# Patient Record
Sex: Male | Born: 1980 | Race: Black or African American | Hispanic: No | Smoking: Never smoker
Health system: Southern US, Community
[De-identification: ages and names within clinical notes are randomized; demographics above are authoritative.]

## PROBLEM LIST (undated history)

## (undated) DIAGNOSIS — C6290 Malignant neoplasm of unspecified testis, unspecified whether descended or undescended: Secondary | ICD-10-CM

## (undated) DIAGNOSIS — C801 Malignant (primary) neoplasm, unspecified: Secondary | ICD-10-CM

## (undated) HISTORY — DX: Malignant neoplasm of unspecified testis, unspecified whether descended or undescended: C62.90

## (undated) HISTORY — PX: SURGERY SCROTAL / TESTICULAR: SUR1316

---

## 2005-12-12 ENCOUNTER — Emergency Department (HOSPITAL_COMMUNITY): Admission: EM | Admit: 2005-12-12 | Discharge: 2005-12-13 | Payer: Self-pay | Admitting: Emergency Medicine

## 2010-05-19 ENCOUNTER — Emergency Department (HOSPITAL_BASED_OUTPATIENT_CLINIC_OR_DEPARTMENT_OTHER)
Admission: EM | Admit: 2010-05-19 | Discharge: 2010-05-19 | Disposition: A | Discharge status: Home / Self Care | Payer: Self-pay | Attending: Emergency Medicine | Admitting: Emergency Medicine

## 2010-05-19 DIAGNOSIS — N509 Disorder of male genital organs, unspecified: Secondary | ICD-10-CM | POA: Insufficient documentation

## 2010-05-19 LAB — URINALYSIS, ROUTINE W REFLEX MICROSCOPIC
Leukocytes, UA: NEGATIVE
Nitrite: NEGATIVE
Specific Gravity, Urine: 1.027 (ref 1.005–1.030)
Urine Glucose, Fasting: NEGATIVE mg/dL
pH: 6 (ref 5.0–8.0)

## 2010-05-19 LAB — URINE MICROSCOPIC-ADD ON

## 2010-05-21 LAB — URINE CULTURE

## 2010-06-18 ENCOUNTER — Emergency Department (HOSPITAL_BASED_OUTPATIENT_CLINIC_OR_DEPARTMENT_OTHER)
Admission: EM | Admit: 2010-06-18 | Discharge: 2010-06-18 | Disposition: A | Discharge status: Home / Self Care | Payer: Self-pay | Attending: Emergency Medicine | Admitting: Emergency Medicine

## 2010-06-18 ENCOUNTER — Emergency Department (INDEPENDENT_AMBULATORY_CARE_PROVIDER_SITE_OTHER): Payer: Self-pay

## 2010-06-18 DIAGNOSIS — N508 Other specified disorders of male genital organs: Secondary | ICD-10-CM | POA: Insufficient documentation

## 2010-06-18 DIAGNOSIS — N433 Hydrocele, unspecified: Secondary | ICD-10-CM | POA: Insufficient documentation

## 2010-06-18 DIAGNOSIS — R9389 Abnormal findings on diagnostic imaging of other specified body structures: Secondary | ICD-10-CM

## 2010-06-18 LAB — URINALYSIS, ROUTINE W REFLEX MICROSCOPIC
Glucose, UA: NEGATIVE mg/dL
Protein, ur: NEGATIVE mg/dL
Specific Gravity, Urine: 1.026 (ref 1.005–1.030)
pH: 5.5 (ref 5.0–8.0)

## 2010-06-23 ENCOUNTER — Other Ambulatory Visit (HOSPITAL_COMMUNITY): Payer: Self-pay | Admitting: Urology

## 2010-06-23 DIAGNOSIS — D4959 Neoplasm of unspecified behavior of other genitourinary organ: Secondary | ICD-10-CM

## 2010-06-26 ENCOUNTER — Other Ambulatory Visit: Payer: Self-pay | Admitting: Urology

## 2010-06-26 ENCOUNTER — Ambulatory Visit (HOSPITAL_COMMUNITY)
Admission: RE | Admit: 2010-06-26 | Discharge: 2010-06-26 | Disposition: A | Discharge status: Home / Self Care | Payer: Self-pay | Source: Ambulatory Visit | Attending: Urology | Admitting: Urology

## 2010-06-26 ENCOUNTER — Encounter (HOSPITAL_COMMUNITY): Payer: Self-pay | Attending: Urology

## 2010-06-26 DIAGNOSIS — N509 Disorder of male genital organs, unspecified: Secondary | ICD-10-CM | POA: Insufficient documentation

## 2010-06-26 DIAGNOSIS — N453 Epididymo-orchitis: Secondary | ICD-10-CM | POA: Insufficient documentation

## 2010-06-26 DIAGNOSIS — D4959 Neoplasm of unspecified behavior of other genitourinary organ: Secondary | ICD-10-CM | POA: Insufficient documentation

## 2010-06-26 DIAGNOSIS — Z01812 Encounter for preprocedural laboratory examination: Secondary | ICD-10-CM | POA: Insufficient documentation

## 2010-06-26 LAB — SURGICAL PCR SCREEN: MRSA, PCR: NEGATIVE

## 2010-06-27 ENCOUNTER — Other Ambulatory Visit (HOSPITAL_COMMUNITY): Payer: Self-pay

## 2010-06-30 ENCOUNTER — Other Ambulatory Visit: Payer: Self-pay | Admitting: Urology

## 2010-06-30 ENCOUNTER — Ambulatory Visit (HOSPITAL_COMMUNITY)
Admission: RE | Admit: 2010-06-30 | Discharge: 2010-06-30 | Disposition: A | Discharge status: Home / Self Care | Payer: Self-pay | Source: Ambulatory Visit | Attending: Urology | Admitting: Urology

## 2010-06-30 DIAGNOSIS — C6290 Malignant neoplasm of unspecified testis, unspecified whether descended or undescended: Secondary | ICD-10-CM | POA: Insufficient documentation

## 2010-07-01 NOTE — Op Note (Signed)
Devin Finley, Devin Finley              ACCOUNT NO.:  [id number]  MEDICAL RECORD NO.:  [id number]           PATIENT TYPE:  O  LOCATION:  DAYL                         FACILITY:  [facility]  PHYSICIAN:  Tillmon Kisling C. Vernie Ammons, M.D.  DATE OF BIRTH:  Aug 17, 1980  DATE OF PROCEDURE:  06/30/2010 DATE OF DISCHARGE:                              OPERATIVE REPORT   PREOPERATIVE DIAGNOSIS:  Left testicular neoplasm.  POSTOPERATIVE DIAGNOSIS:  Left testicular neoplasm.  PROCEDURES: 1. Left radical orchiectomy. 2. Left testicular prosthesis placement.  SURGEON:  Kathrine Rieves C. Vernie Ammons, M.D.  ANESTHESIA:  General.  SPECIMENS:  Left testicle and spermatic cord to pathology.  DRAINS:  None.  TESTICULAR PROSTHESIS:  2.9 x 4.5 cm, 20 cc saline filled  BLOOD LOSS:  Minimal.  COMPLICATIONS:  None.  INDICATIONS:  The patient is a 30 year old male who was having left testicular pain and a scrotal ultrasound was performed.  He was treated with antibiotics and despite this the ultrasound revealed what appeared to be an intratesticular lesion.  Tumor markers were found to be negative (beta HCG and alpha-fetoprotein).  He was maintained on antibiotics and a repeat scrotal ultrasound was performed and the lesion appeared to remain unchanged with possible satellite lesion present within the testicle as well.  We therefore had discussed radical orchiectomy.  He understands the risks, complications and alternatives. He also desires to have a testicular prosthesis inserted at the time his testicle is removed.  DESCRIPTION OF OPERATION:  After informed consent, the patient was brought to the major OR, placed on table, and administered general anesthesia.  His genitalia was sterilely prepped and draped including the inguinal region and scrotum.  An official time-out was then performed.  An inguinal incision was then made on the left-hand side following lines of Langer and carried down through the subcutaneous tissue to  expose the external oblique aponeurosis.  I identified the external inguinal ring and divided the fascia along the fibers and through the center of the external ring and then back towards the internal ring.  The ilioinguinal nerve was identified and spared.  The cord was identified and dissected from the floor of the inguinal canal.  A 0.25-inch Penrose drain was then passed around this and I then dissected the cord back to the internal ring.  The cord was then isolated with hemostats into 2 equal portions and clamped.  I then proceeded to deliver the testicle from the left hemiscrotum into the incision and divided the gubernaculum.  I then divided the cord distal to the clamps and the specimen was passed off.  I then doubly ligated both of the cord sections by first placing a 2-0 Vicryl as a suture ligature and then a 0 silk tie was applied.  The clamps were removed, no bleeding was noted and the silks were left long and this was tucked back into the internal ring.  The wound was then irrigated with saline.  I then determined the dependent portion of the scrotum and everted this into the inguinal incision.  A 3-0 silk suture was then placed through the tissue over my index finger.  I measured his  right testicle and it appeared to measure 4.5 x 2.5 cm.  I therefore chose the above-noted prosthesis and filled it with saline and adjusted it so that its consistency was similar to the right testicle.  I then placed the silk through the eye in the dependent portion of the prosthesis and tied this.  The prosthesis was then placed in the left hemiscrotum and it was noted to lay in good position.  The wound was irrigated once again with saline and I then closed the external oblique fascia with running 3-0 Vicryl suture with care being taken to exclude the ilioinguinal nerve. A 0.5% Marcaine with epinephrine was then used to infiltrate the subcutaneous tissue of the incision and interrupted 0  chromic was used to reapproximate the deep tissue and take tension off the skin and skin incision which was then closed with running 4-0 Monocryl suture. Sterile gauze was applied and the patient was awakened and taken to recovery room in stable and satisfactory condition.  He tolerated the procedure well and there were no intraoperative complications.  He was given a prescription upon discharge for Vicodin ES #36 and doxycycline 100 mg q.12 h #10 with followup in my office in 1 week and written discharge instructions.     Neelam Tiggs C. Vernie Ammons, M.D.     MCO/MEDQ  D:  06/30/2010  T:  06/30/2010  Job:  [id number]  Electronically Signed by Ihor Gully M.D. on 07/01/2010 06:50:58 AM

## 2010-07-10 ENCOUNTER — Other Ambulatory Visit (HOSPITAL_COMMUNITY): Payer: Self-pay | Admitting: Urology

## 2010-07-10 DIAGNOSIS — C6290 Malignant neoplasm of unspecified testis, unspecified whether descended or undescended: Secondary | ICD-10-CM

## 2010-07-16 ENCOUNTER — Encounter (HOSPITAL_COMMUNITY): Payer: Self-pay

## 2010-07-16 ENCOUNTER — Ambulatory Visit (HOSPITAL_COMMUNITY)
Admission: RE | Admit: 2010-07-16 | Discharge: 2010-07-16 | Disposition: A | Discharge status: Home / Self Care | Payer: Self-pay | Source: Ambulatory Visit | Attending: Urology | Admitting: Urology

## 2010-07-16 DIAGNOSIS — J984 Other disorders of lung: Secondary | ICD-10-CM | POA: Insufficient documentation

## 2010-07-16 DIAGNOSIS — M899 Disorder of bone, unspecified: Secondary | ICD-10-CM | POA: Insufficient documentation

## 2010-07-16 DIAGNOSIS — C6290 Malignant neoplasm of unspecified testis, unspecified whether descended or undescended: Secondary | ICD-10-CM | POA: Insufficient documentation

## 2010-07-16 DIAGNOSIS — Z9079 Acquired absence of other genital organ(s): Secondary | ICD-10-CM | POA: Insufficient documentation

## 2010-07-16 HISTORY — DX: Malignant (primary) neoplasm, unspecified: C80.1

## 2010-07-16 MED ORDER — IOHEXOL 300 MG/ML  SOLN
125.0000 mL | Freq: Once | INTRAMUSCULAR | Status: AC | PRN
  Administered 2010-07-16: 125 mL via INTRAVENOUS

## 2010-07-22 ENCOUNTER — Ambulatory Visit: Payer: Self-pay | Admitting: Hematology & Oncology

## 2010-07-29 ENCOUNTER — Ambulatory Visit (HOSPITAL_BASED_OUTPATIENT_CLINIC_OR_DEPARTMENT_OTHER): Payer: Self-pay | Admitting: Hematology & Oncology

## 2010-07-29 ENCOUNTER — Other Ambulatory Visit: Payer: Self-pay | Admitting: Hematology & Oncology

## 2010-07-29 DIAGNOSIS — C6290 Malignant neoplasm of unspecified testis, unspecified whether descended or undescended: Secondary | ICD-10-CM

## 2010-07-29 LAB — CBC WITH DIFFERENTIAL (CANCER CENTER ONLY)
EOS%: 2.2 % (ref 0.0–7.0)
Eosinophils Absolute: 0.1 10*3/uL (ref 0.0–0.5)
LYMPH#: 1.9 10*3/uL (ref 0.9–3.3)
MCH: 30.5 pg (ref 28.0–33.4)
MONO%: 8.3 % (ref 0.0–13.0)
NEUT#: 2.2 10*3/uL (ref 1.5–6.5)
Platelets: 166 10*3/uL (ref 145–400)
RBC: 5.25 10*6/uL (ref 4.20–5.70)
WBC: 4.6 10*3/uL (ref 4.0–10.0)

## 2010-07-31 LAB — COMPREHENSIVE METABOLIC PANEL
ALT: 50 U/L (ref 0–53)
Alkaline Phosphatase: 95 U/L (ref 39–117)
CO2: 21 mEq/L (ref 19–32)
Sodium: 138 mEq/L (ref 135–145)
Total Bilirubin: 0.4 mg/dL (ref 0.3–1.2)
Total Protein: 7.5 g/dL (ref 6.0–8.3)

## 2010-07-31 LAB — LACTATE DEHYDROGENASE: LDH: 204 U/L (ref 94–250)

## 2010-08-04 ENCOUNTER — Encounter (HOSPITAL_BASED_OUTPATIENT_CLINIC_OR_DEPARTMENT_OTHER): Payer: Self-pay | Admitting: Hematology & Oncology

## 2010-08-04 DIAGNOSIS — C6290 Malignant neoplasm of unspecified testis, unspecified whether descended or undescended: Secondary | ICD-10-CM

## 2010-08-04 DIAGNOSIS — Z5111 Encounter for antineoplastic chemotherapy: Secondary | ICD-10-CM

## 2010-08-26 ENCOUNTER — Other Ambulatory Visit: Payer: Self-pay | Admitting: Hematology & Oncology

## 2010-08-26 ENCOUNTER — Encounter (HOSPITAL_BASED_OUTPATIENT_CLINIC_OR_DEPARTMENT_OTHER): Payer: Self-pay | Admitting: Hematology & Oncology

## 2010-08-26 DIAGNOSIS — R110 Nausea: Secondary | ICD-10-CM

## 2010-08-26 DIAGNOSIS — C6290 Malignant neoplasm of unspecified testis, unspecified whether descended or undescended: Secondary | ICD-10-CM

## 2010-08-26 LAB — CBC WITH DIFFERENTIAL (CANCER CENTER ONLY)
BASO#: 0 10*3/uL (ref 0.0–0.2)
EOS%: 1.1 % (ref 0.0–7.0)
HCT: 40.2 % (ref 38.7–49.9)
HGB: 14.1 g/dL (ref 13.0–17.1)
MCH: 30.6 pg (ref 28.0–33.4)
MCHC: 35.1 g/dL (ref 32.0–35.9)
MONO%: 8.5 % (ref 0.0–13.0)
NEUT%: 53.9 % (ref 40.0–80.0)

## 2010-08-28 LAB — COMPREHENSIVE METABOLIC PANEL
ALT: 666 U/L — ABNORMAL HIGH (ref 0–53)
AST: 274 U/L — ABNORMAL HIGH (ref 0–37)
Albumin: 4.5 g/dL (ref 3.5–5.2)
BUN: 18 mg/dL (ref 6–23)
CO2: 26 mEq/L (ref 19–32)
Calcium: 9.6 mg/dL (ref 8.4–10.5)
Chloride: 101 mEq/L (ref 96–112)
Creatinine, Ser: 1.12 mg/dL (ref 0.50–1.35)
Potassium: 3.9 mEq/L (ref 3.5–5.3)

## 2010-08-28 LAB — BETA HCG QUANT (REF LAB): Beta hCG, Tumor Marker: <0.5 m[IU]/mL (ref <5.0)

## 2010-08-28 LAB — LACTATE DEHYDROGENASE: LDH: 380 U/L — ABNORMAL HIGH (ref 94–250)

## 2010-08-29 ENCOUNTER — Other Ambulatory Visit: Payer: Self-pay | Admitting: Hematology & Oncology

## 2010-08-29 ENCOUNTER — Encounter (HOSPITAL_BASED_OUTPATIENT_CLINIC_OR_DEPARTMENT_OTHER): Payer: Self-pay | Admitting: Hematology & Oncology

## 2010-08-29 DIAGNOSIS — Z5111 Encounter for antineoplastic chemotherapy: Secondary | ICD-10-CM

## 2010-08-29 DIAGNOSIS — C6290 Malignant neoplasm of unspecified testis, unspecified whether descended or undescended: Secondary | ICD-10-CM

## 2010-08-29 LAB — CMP (CANCER CENTER ONLY)
ALT(SGPT): 403 U/L — ABNORMAL HIGH (ref 10–47)
AST: 126 U/L — ABNORMAL HIGH (ref 11–38)
Alkaline Phosphatase: 73 U/L (ref 26–84)
CO2: 29 mEq/L (ref 18–33)
Creat: 1 mg/dl (ref 0.6–1.2)
Sodium: 138 mEq/L (ref 128–145)
Total Bilirubin: 0.6 mg/dl (ref 0.20–1.60)
Total Protein: 7.9 g/dL (ref 6.4–8.1)

## 2010-08-29 LAB — CBC WITH DIFFERENTIAL (CANCER CENTER ONLY)
BASO%: 0.3 % (ref 0.0–2.0)
EOS%: 1.4 % (ref 0.0–7.0)
HCT: 40.5 % (ref 38.7–49.9)
LYMPH%: 36.2 % (ref 14.0–48.0)
MCHC: 34.8 g/dL (ref 32.0–35.9)
MCV: 89 fL (ref 82–98)
MONO#: 0.3 10*3/uL (ref 0.1–0.9)
NEUT%: 53.8 % (ref 40.0–80.0)
Platelets: 173 10*3/uL (ref 145–400)
RDW: 13.8 % (ref 11.1–15.7)
WBC: 3.5 10*3/uL — ABNORMAL LOW (ref 4.0–10.0)

## 2010-10-15 ENCOUNTER — Other Ambulatory Visit: Payer: Self-pay | Admitting: Hematology & Oncology

## 2010-10-15 ENCOUNTER — Encounter (HOSPITAL_BASED_OUTPATIENT_CLINIC_OR_DEPARTMENT_OTHER): Payer: Self-pay | Admitting: Hematology & Oncology

## 2010-10-15 DIAGNOSIS — C6290 Malignant neoplasm of unspecified testis, unspecified whether descended or undescended: Secondary | ICD-10-CM

## 2010-10-15 DIAGNOSIS — Z5111 Encounter for antineoplastic chemotherapy: Secondary | ICD-10-CM

## 2010-10-15 DIAGNOSIS — R52 Pain, unspecified: Secondary | ICD-10-CM

## 2010-10-15 DIAGNOSIS — R110 Nausea: Secondary | ICD-10-CM

## 2010-10-15 LAB — CBC WITH DIFFERENTIAL (CANCER CENTER ONLY)
BASO#: 0 10*3/uL (ref 0.0–0.2)
EOS%: 2.8 % (ref 0.0–7.0)
Eosinophils Absolute: 0.1 10*3/uL (ref 0.0–0.5)
HCT: 40.3 % (ref 38.7–49.9)
HGB: 14.5 g/dL (ref 13.0–17.1)
MCH: 31.7 pg (ref 28.0–33.4)
MCHC: 36 g/dL — ABNORMAL HIGH (ref 32.0–35.9)
MCV: 88 fL (ref 82–98)
MONO%: 10.3 % (ref 0.0–13.0)
NEUT#: 1.8 10*3/uL (ref 1.5–6.5)
NEUT%: 35.1 % — ABNORMAL LOW (ref 40.0–80.0)
RBC: 4.57 10*6/uL (ref 4.20–5.70)

## 2010-10-18 LAB — COMPREHENSIVE METABOLIC PANEL
Alkaline Phosphatase: 121 U/L — ABNORMAL HIGH (ref 39–117)
Creatinine, Ser: 1.17 mg/dL (ref 0.50–1.35)
Glucose, Bld: 98 mg/dL (ref 70–99)
Sodium: 138 mEq/L (ref 135–145)
Total Bilirubin: 0.3 mg/dL (ref 0.3–1.2)
Total Protein: 7 g/dL (ref 6.0–8.3)

## 2010-10-18 LAB — LACTATE DEHYDROGENASE: LDH: 195 U/L (ref 94–250)

## 2010-10-18 LAB — AFP TUMOR MARKER: AFP-Tumor Marker: 3.1 ng/mL (ref 0.0–8.0)

## 2010-11-12 ENCOUNTER — Ambulatory Visit (HOSPITAL_BASED_OUTPATIENT_CLINIC_OR_DEPARTMENT_OTHER)
Admission: RE | Admit: 2010-11-12 | Discharge: 2010-11-12 | Disposition: A | Discharge status: Home / Self Care | Payer: Self-pay | Source: Ambulatory Visit | Attending: Hematology & Oncology | Admitting: Hematology & Oncology

## 2010-11-12 DIAGNOSIS — Z8547 Personal history of malignant neoplasm of testis: Secondary | ICD-10-CM

## 2010-11-12 DIAGNOSIS — Z9079 Acquired absence of other genital organ(s): Secondary | ICD-10-CM

## 2010-11-12 DIAGNOSIS — Z0389 Encounter for observation for other suspected diseases and conditions ruled out: Secondary | ICD-10-CM

## 2010-11-12 DIAGNOSIS — C6290 Malignant neoplasm of unspecified testis, unspecified whether descended or undescended: Secondary | ICD-10-CM | POA: Insufficient documentation

## 2010-11-12 MED ORDER — IOHEXOL 300 MG/ML  SOLN: 100.0000 mL | Freq: Once | INTRAMUSCULAR | Status: DC | PRN

## 2010-11-20 ENCOUNTER — Encounter (HOSPITAL_BASED_OUTPATIENT_CLINIC_OR_DEPARTMENT_OTHER): Payer: Self-pay | Admitting: Hematology & Oncology

## 2010-11-20 ENCOUNTER — Other Ambulatory Visit: Payer: Self-pay | Admitting: Hematology & Oncology

## 2010-11-20 DIAGNOSIS — Z5111 Encounter for antineoplastic chemotherapy: Secondary | ICD-10-CM

## 2010-11-20 DIAGNOSIS — R110 Nausea: Secondary | ICD-10-CM

## 2010-11-20 DIAGNOSIS — C6290 Malignant neoplasm of unspecified testis, unspecified whether descended or undescended: Secondary | ICD-10-CM

## 2010-11-20 LAB — CBC WITH DIFFERENTIAL (CANCER CENTER ONLY)
BASO%: 0.4 % (ref 0.0–2.0)
EOS%: 1.5 % (ref 0.0–7.0)
HCT: 42.5 % (ref 38.7–49.9)
LYMPH%: 47.9 % (ref 14.0–48.0)
MCHC: 36 g/dL — ABNORMAL HIGH (ref 32.0–35.9)
MCV: 89 fL (ref 82–98)
MONO#: 0.3 10*3/uL (ref 0.1–0.9)
MONO%: 7.1 % (ref 0.0–13.0)
NEUT%: 43.1 % (ref 40.0–80.0)
Platelets: 170 10*3/uL (ref 145–400)
RDW: 12.6 % (ref 11.1–15.7)
WBC: 4.5 10*3/uL (ref 4.0–10.0)

## 2010-11-23 LAB — COMPREHENSIVE METABOLIC PANEL WITH GFR
ALT: 54 U/L — ABNORMAL HIGH (ref 0–53)
AST: 48 U/L — ABNORMAL HIGH (ref 0–37)
Albumin: 4.2 g/dL (ref 3.5–5.2)
Alkaline Phosphatase: 104 U/L (ref 39–117)
BUN: 20 mg/dL (ref 6–23)
CO2: 22 meq/L (ref 19–32)
Calcium: 9.3 mg/dL (ref 8.4–10.5)
Chloride: 105 meq/L (ref 96–112)
Creatinine, Ser: 1.08 mg/dL (ref 0.50–1.35)
Glucose, Bld: 125 mg/dL — ABNORMAL HIGH (ref 70–99)
Potassium: 4 meq/L (ref 3.5–5.3)
Sodium: 138 meq/L (ref 135–145)
Total Bilirubin: 0.4 mg/dL (ref 0.3–1.2)
Total Protein: 7 g/dL (ref 6.0–8.3)

## 2010-11-23 LAB — AFP TUMOR MARKER: AFP-Tumor Marker: 3.2 ng/mL (ref 0.0–8.0)

## 2010-11-23 LAB — LACTATE DEHYDROGENASE: LDH: 219 U/L (ref 94–250)

## 2010-11-28 ENCOUNTER — Other Ambulatory Visit: Payer: Self-pay | Admitting: Hematology & Oncology

## 2010-11-28 DIAGNOSIS — C6290 Malignant neoplasm of unspecified testis, unspecified whether descended or undescended: Secondary | ICD-10-CM

## 2011-02-19 ENCOUNTER — Other Ambulatory Visit (HOSPITAL_BASED_OUTPATIENT_CLINIC_OR_DEPARTMENT_OTHER): Payer: Self-pay | Admitting: Lab

## 2011-02-19 ENCOUNTER — Other Ambulatory Visit: Payer: Self-pay | Admitting: Hematology & Oncology

## 2011-02-19 ENCOUNTER — Ambulatory Visit (HOSPITAL_BASED_OUTPATIENT_CLINIC_OR_DEPARTMENT_OTHER)
Admission: RE | Admit: 2011-02-19 | Discharge: 2011-02-19 | Disposition: A | Discharge status: Home / Self Care | Payer: Self-pay | Source: Ambulatory Visit | Attending: Hematology & Oncology | Admitting: Hematology & Oncology

## 2011-02-19 ENCOUNTER — Ambulatory Visit (INDEPENDENT_AMBULATORY_CARE_PROVIDER_SITE_OTHER)
Admission: RE | Admit: 2011-02-19 | Discharge: 2011-02-19 | Disposition: A | Discharge status: Home / Self Care | Payer: Self-pay | Source: Ambulatory Visit | Attending: Hematology & Oncology | Admitting: Hematology & Oncology

## 2011-02-19 DIAGNOSIS — R52 Pain, unspecified: Secondary | ICD-10-CM

## 2011-02-19 DIAGNOSIS — Z09 Encounter for follow-up examination after completed treatment for conditions other than malignant neoplasm: Secondary | ICD-10-CM | POA: Insufficient documentation

## 2011-02-19 DIAGNOSIS — N62 Hypertrophy of breast: Secondary | ICD-10-CM

## 2011-02-19 DIAGNOSIS — C6290 Malignant neoplasm of unspecified testis, unspecified whether descended or undescended: Secondary | ICD-10-CM

## 2011-02-19 DIAGNOSIS — R110 Nausea: Secondary | ICD-10-CM

## 2011-02-19 DIAGNOSIS — I880 Nonspecific mesenteric lymphadenitis: Secondary | ICD-10-CM

## 2011-02-19 DIAGNOSIS — Z5111 Encounter for antineoplastic chemotherapy: Secondary | ICD-10-CM

## 2011-02-19 LAB — CMP (CANCER CENTER ONLY)
ALT(SGPT): 90 U/L — ABNORMAL HIGH (ref 10–47)
AST: 90 U/L — ABNORMAL HIGH (ref 11–38)
BUN, Bld: 27 mg/dL — ABNORMAL HIGH (ref 7–22)
Creat: 1 mg/dl (ref 0.6–1.2)
Total Bilirubin: 0.4 mg/dl (ref 0.20–1.60)

## 2011-02-19 LAB — CBC WITH DIFFERENTIAL (CANCER CENTER ONLY)
BASO%: 0.3 % (ref 0.0–2.0)
LYMPH%: 38 % (ref 14.0–48.0)
MCV: 90 fL (ref 82–98)
MONO#: 0.6 10*3/uL (ref 0.1–0.9)
MONO%: 10.4 % (ref 0.0–13.0)
Platelets: 176 10*3/uL (ref 145–400)
RDW: 13.3 % (ref 11.1–15.7)
WBC: 5.9 10*3/uL (ref 4.0–10.0)

## 2011-02-19 MED ORDER — IOHEXOL 300 MG/ML  SOLN
100.0000 mL | Freq: Once | INTRAMUSCULAR | Status: AC | PRN
  Administered 2011-02-19: 100 mL via INTRAVENOUS

## 2011-02-23 LAB — AFP TUMOR MARKER: AFP-Tumor Marker: 2.4 ng/mL (ref 0.0–8.0)

## 2011-02-23 LAB — BETA HCG QUANT (REF LAB): Beta hCG, Tumor Marker: <0.5 m[IU]/mL (ref <5.0)

## 2011-02-26 ENCOUNTER — Encounter: Payer: Self-pay | Admitting: Hematology & Oncology

## 2011-02-26 ENCOUNTER — Ambulatory Visit (HOSPITAL_BASED_OUTPATIENT_CLINIC_OR_DEPARTMENT_OTHER): Payer: Self-pay | Admitting: Hematology & Oncology

## 2011-02-26 DIAGNOSIS — C6290 Malignant neoplasm of unspecified testis, unspecified whether descended or undescended: Secondary | ICD-10-CM

## 2011-02-26 DIAGNOSIS — K752 Nonspecific reactive hepatitis: Secondary | ICD-10-CM

## 2011-02-26 HISTORY — DX: Malignant neoplasm of unspecified testis, unspecified whether descended or undescended: C62.90

## 2011-02-26 NOTE — Progress Notes (Signed)
CC:   Mark C. Vernie Ammons, M.D.  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  CURRENT THERAPY:  Observation.  INTERIM HISTORY:  Mr. Mousseau comes in for followup.  He is doing well. Unfortunately he is now separated from his wife.  This is unfortunate. I feel bad for him.  He is working without any problems.  He is enjoying this.  He had a good Thanksgiving.  He is working out quit a bit.  He is taking protein supplements to maintain his muscle mass.  When we last saw him in August, his tumor markers were normal.  Alpha- fetoprotein was 3.2.  His beta hCG was less than 0.5.  LDH was 219.  He has had elevated LFTs on occasion.  I am not sure as to what the cause of this has been.  He did have recent scans done.  He had a CT of the chest, abdomen, and pelvis back on 11/29.  The CT scan showed no evidence of thoracic disease.  He did have left periaortic lymph nodes which are less than 1 cm.  It is unchanged.  There is a mesenteric lymph node measuring 1.4 cm, again which is unchanged.  There is an additional mesenteric lymph node which appeared to be minimally larger measuring 1.2 cm.  His liver looks okay with no focal hepatic or gallbladder lesions.  He has had no problems going to the bathroom.  He has had no abdominal pain.  He has had no cough.  There is no dyspnea or tachypnea.  He has no dysphasia or odynophagia.  PHYSICAL EXAM:  General:  This is a well-developed well-nourished African American gentleman who is quite muscular.  Vital signs: Temperature of 97.4, pulse 70, respiratory 18, blood pressure 115/74, weight 224.  Head and neck:  Shows a normocephalic, atraumatic skull. There are no ocular or oral lesions.  No palpable cervical or supraclavicular lymph nodes.  Lungs:  Clear bilaterally.  Cardiac: Regular rhythm with normal S1, S2.  No murmurs, rubs or bruits. Abdomen:  Soft with good bowel sounds.  There are no palpable abdominal mass.  There is no  palpable hepatosplenomegaly.  He has well-healed left inguinal orchiectomy scar.  Axillary exam shows no bilateral axillary adenopathy.  Extremities:  Shows no clubbing, cyanosis or edema.  Skin: No rashes, ecchymoses or petechiae.  Neurological:  No focal neurological deficits.  LABORATORY STUDIES:  White cell count of 5.9, hemoglobin 16.1, hematocrit 47.8,  platelet count 176.  His electrolytes all appear unremarkable.  His SGPT is 90, SGOT is 90.  Alkaline phosphatase is 121. LDH is 269.  Beta hCG is less than 0.5.  Alpha-fetoprotein is 2.4.  IMPRESSION AND PLAN:  Mr. Hinchman is a real nice 30 year old African American gentleman with a stage I seminoma of the left testicle.  He had this resected.  We gave him 1 cycle of adjuvant chemotherapy with high- dose carboplatin.  This was back in May.  I still do not see any evidence of recurrent disease.  I think these lymph nodes are more reactionary than malignant.  As far as his LFTs are concerned, I told Mr. Gilkerson to stop taking any supplements for his working out.  It is possible that he may be taking some that could be causing some liver inflammation.  I do want to repeat his liver function tests in 1 month.  He does not need to have another CT scan until late February or early March.  We will get these all set up.  I will plan to see him back myself after his scans are done in March.    ______________________________ Josph Macho, M.D. PRE/MEDQ  D:  02/26/2011  T:  02/26/2011  Job:  [id number]

## 2011-02-26 NOTE — Progress Notes (Signed)
This office note has been dictated.

## 2011-02-27 ENCOUNTER — Telehealth: Payer: Self-pay | Admitting: *Deleted

## 2011-02-27 NOTE — Telephone Encounter (Signed)
Mailed Pt's Jan, Feb, March schedules to Pt with instruction sheet for CT

## 2011-03-30 ENCOUNTER — Other Ambulatory Visit (HOSPITAL_BASED_OUTPATIENT_CLINIC_OR_DEPARTMENT_OTHER): Payer: Self-pay

## 2011-03-30 ENCOUNTER — Other Ambulatory Visit: Payer: Self-pay | Admitting: Lab

## 2011-03-31 ENCOUNTER — Telehealth: Payer: Self-pay | Admitting: Hematology & Oncology

## 2011-03-31 NOTE — Telephone Encounter (Signed)
Pt called and resch 03/30/11 missed apt to 04/01/11

## 2011-04-01 ENCOUNTER — Other Ambulatory Visit (HOSPITAL_BASED_OUTPATIENT_CLINIC_OR_DEPARTMENT_OTHER): Payer: Self-pay | Admitting: Lab

## 2011-04-01 DIAGNOSIS — C6290 Malignant neoplasm of unspecified testis, unspecified whether descended or undescended: Secondary | ICD-10-CM

## 2011-04-01 DIAGNOSIS — K752 Nonspecific reactive hepatitis: Secondary | ICD-10-CM

## 2011-04-01 DIAGNOSIS — K759 Inflammatory liver disease, unspecified: Secondary | ICD-10-CM

## 2011-04-01 LAB — HEPATIC FUNCTION PANEL
ALT: 61 U/L — ABNORMAL HIGH (ref 0–53)
AST: 50 U/L — ABNORMAL HIGH (ref 0–37)
Albumin: 4.5 g/dL (ref 3.5–5.2)
Alkaline Phosphatase: 108 U/L (ref 39–117)
Indirect Bilirubin: 0.3 mg/dL (ref 0.0–0.9)
Total Protein: 7.2 g/dL (ref 6.0–8.3)

## 2011-05-21 ENCOUNTER — Ambulatory Visit (HOSPITAL_BASED_OUTPATIENT_CLINIC_OR_DEPARTMENT_OTHER)
Admission: RE | Admit: 2011-05-21 | Discharge: 2011-05-21 | Disposition: A | Discharge status: Home / Self Care | Payer: Self-pay | Source: Ambulatory Visit | Attending: Hematology & Oncology | Admitting: Hematology & Oncology

## 2011-05-21 ENCOUNTER — Ambulatory Visit (INDEPENDENT_AMBULATORY_CARE_PROVIDER_SITE_OTHER)
Admission: RE | Admit: 2011-05-21 | Discharge: 2011-05-21 | Disposition: A | Discharge status: Home / Self Care | Payer: Self-pay | Source: Ambulatory Visit | Attending: Hematology & Oncology | Admitting: Hematology & Oncology

## 2011-05-21 ENCOUNTER — Other Ambulatory Visit (HOSPITAL_BASED_OUTPATIENT_CLINIC_OR_DEPARTMENT_OTHER): Payer: Self-pay | Admitting: Lab

## 2011-05-21 DIAGNOSIS — C6290 Malignant neoplasm of unspecified testis, unspecified whether descended or undescended: Secondary | ICD-10-CM | POA: Insufficient documentation

## 2011-05-21 DIAGNOSIS — K752 Nonspecific reactive hepatitis: Secondary | ICD-10-CM

## 2011-05-21 DIAGNOSIS — Z9079 Acquired absence of other genital organ(s): Secondary | ICD-10-CM

## 2011-05-21 DIAGNOSIS — R599 Enlarged lymph nodes, unspecified: Secondary | ICD-10-CM | POA: Insufficient documentation

## 2011-05-21 DIAGNOSIS — N62 Hypertrophy of breast: Secondary | ICD-10-CM

## 2011-05-21 LAB — CMP (CANCER CENTER ONLY)
ALT(SGPT): 54 U/L — ABNORMAL HIGH (ref 10–47)
AST: 51 U/L — ABNORMAL HIGH (ref 11–38)
Albumin: 3.7 g/dL (ref 3.3–5.5)
Alkaline Phosphatase: 108 U/L — ABNORMAL HIGH (ref 26–84)
BUN, Bld: 22 mg/dL (ref 7–22)
Potassium: 4.6 mEq/L (ref 3.3–4.7)
Sodium: 139 mEq/L (ref 128–145)
Total Protein: 7.7 g/dL (ref 6.4–8.1)

## 2011-05-21 LAB — CBC WITH DIFFERENTIAL (CANCER CENTER ONLY)
Eosinophils Absolute: 0.1 10*3/uL (ref 0.0–0.5)
LYMPH#: 2.4 10*3/uL (ref 0.9–3.3)
MONO#: 0.8 10*3/uL (ref 0.1–0.9)
NEUT#: 3.4 10*3/uL (ref 1.5–6.5)
Platelets: 161 10*3/uL (ref 145–400)
RBC: 5.28 10*6/uL (ref 4.20–5.70)
WBC: 6.7 10*3/uL (ref 4.0–10.0)

## 2011-05-21 MED ORDER — IOHEXOL 300 MG/ML  SOLN
100.0000 mL | Freq: Once | INTRAMUSCULAR | Status: AC | PRN
  Administered 2011-05-21: 100 mL via INTRAVENOUS

## 2011-05-25 LAB — LACTATE DEHYDROGENASE: LDH: 291 U/L — ABNORMAL HIGH (ref 94–250)

## 2011-05-25 LAB — AFP TUMOR MARKER: AFP-Tumor Marker: 2.2 ng/mL (ref 0.0–8.0)

## 2011-05-25 LAB — BETA HCG QUANT (REF LAB): Beta hCG, Tumor Marker: 1.5 m[IU]/mL (ref <5.0)

## 2011-05-26 ENCOUNTER — Telehealth: Payer: Self-pay | Admitting: Hematology & Oncology

## 2011-05-26 NOTE — Telephone Encounter (Signed)
I left a message on his cell phone about the CT scan results.  I was worried that the cancer had returned and that we are going to need more aggressive chemo for this.  I really feel awful about this!!!  He is a real nice guy.  i feel that we can cure this but it will take more chemo, which I hate!!     I told him to call me anytime.  Cindee Lame

## 2011-05-29 ENCOUNTER — Telehealth: Payer: Self-pay | Admitting: Hematology & Oncology

## 2011-05-29 ENCOUNTER — Ambulatory Visit: Payer: Self-pay | Admitting: Hematology & Oncology

## 2011-05-29 NOTE — Telephone Encounter (Signed)
Pt aware of 3-14 and understands it's very important

## 2011-05-29 NOTE — Telephone Encounter (Signed)
Unable to reach pt tried all numbers left message for him to call.

## 2011-05-29 NOTE — Telephone Encounter (Signed)
Pt called yesterday confirming 3-8 appointment. I left him a message to see if he was coming because he is late.

## 2011-05-29 NOTE — Telephone Encounter (Signed)
Pt moved 3-8 to 3-13 he couldn't get a ride

## 2011-06-03 ENCOUNTER — Ambulatory Visit: Payer: Self-pay | Admitting: Hematology & Oncology

## 2011-06-04 ENCOUNTER — Ambulatory Visit (HOSPITAL_BASED_OUTPATIENT_CLINIC_OR_DEPARTMENT_OTHER): Payer: Self-pay | Admitting: Hematology & Oncology

## 2011-06-04 VITALS — BP 147/81 | HR 76 | Temp 97.1°F | Ht 72.0 in | Wt 231.0 lb

## 2011-06-04 DIAGNOSIS — C6290 Malignant neoplasm of unspecified testis, unspecified whether descended or undescended: Secondary | ICD-10-CM

## 2011-06-04 NOTE — Progress Notes (Signed)
This office note has been dictated.

## 2011-06-04 NOTE — Progress Notes (Signed)
CC:   Mark C. Vernie Ammons, M.D.  DIAGNOSIS:  Recurrent seminoma.  CURRENT THERAPY:  Observation.  INTERIM HISTORY:  Mr. Rembold comes in for his followup.  Unfortunately, it looks like he now has recurrence.  I am actually shocked by this.  I really thought that with his adjuvant chemotherapy, he was cured.  He received his adjuvant carboplatin back in April of 2012.  Routine a CT scan done on 05/07/2011, he does have new abdominal lymphadenopathy.  There is a new left periaortic lymph node measuring 2 x 2.3 cm.  He has a somewhat inferior retroperitoneal lymph node measuring 1.1 x 1 cm.  There is a left a mesenteric lymph node measuring 1.5 cm.  We did do his tumor markers.  This showed an LDH of 291.  His alpha fetoprotein was 2.2.  His beta HCG is 1.5.  Although this is normal, this is up from less than 0.5.  I believe that this is all highly indicative of recurrence.  I do not see that we need to put him through any biopsies.  He feels okay.  He does feel tired.  I told him that it is possible that the fatigue might be related to his recurrence.  He does have some gynecomastia which, again, might be related to his underlying malignancy.  He has had no change in bowel or bladder habits.  He has had no cough. He has had no bony pain.  He has had no leg swelling.  He does have some left scrotal discomfort, which he has had since his surgery.  PHYSICAL EXAM:  General: This is an incredibly muscular, black gentleman in no obvious distress.  Vital signs: 97.1, pulse 76, respiratory 16, blood pressure 147/81, and weight is 231.  Head and neck exam shows a normocephalic, atraumatic skull.  There are no ocular or oral lesions. There are no palpable cervical or supraclavicular lymph nodes.  Lungs are clear bilaterally.  Cardiac examination:  Regular rate and rhythm with a normal S1, S2.  There are no murmurs, rubs, or bruits.  Abdominal exam: Soft with good bowel sounds.  He does have a  well-healed left orchectomy scar.  There is no obvious abdominal mass.  There is no inguinal adenopathy bilaterally.  There is no palpable hepatosplenomegaly.  Back exam:  No tenderness over the spine, ribs, or hips.  Extremities: Shows no clubbing, cyanosis, or edema.  Neurological exam: Shows no focal neurological deficits.  LABORATORY STUDIES:  Show a white cell count 6.7, hemoglobin 616.3, hematocrit 47.2, and platelet count 161.  IMPRESSION:  Mr. Sar is a 31 year old gentleman with recurrent seminoma.  I do believe that we will be able to cure this.  I believe that we can get away with 4 cycles of cisplatin/etoposide.  I want to try to avoid bleomycin, given that he is young and quite athletic.  He will need to have a Port-A-Cath placed.  We will see about getting this set up.  We will go ahead and plan on starting him on April 1st.  I feel that we do have the "luxury" of being able to do his treatment starting after Easter.  I spent a good hour with Mr. Bower.  I just am still shocked that he has recurrence.  We will go ahead and plan to get him back to see [place] the day of his 2nd cycle of chemotherapy, which will be on April 22nd.  I do not think he is going to need Neupogen/Neulasta.  We will see how he does with this 1st cycle of chemo without CSF support.    ______________________________ Josph Macho, M.D. PRE/MEDQ  D:  06/04/2011  T:  06/04/2011  Job:  [id number]

## 2011-06-05 ENCOUNTER — Telehealth: Payer: Self-pay | Admitting: Hematology & Oncology

## 2011-06-05 ENCOUNTER — Encounter: Payer: Self-pay | Admitting: Hematology & Oncology

## 2011-06-05 NOTE — Telephone Encounter (Signed)
Patients phone is always busy unable to reach him thru the other numbers also. Mailed schedule to pt for April may and June appointments with a cover letter to give [place] an alternative number to reach him.

## 2011-06-08 ENCOUNTER — Other Ambulatory Visit: Payer: Self-pay | Admitting: *Deleted

## 2011-06-08 ENCOUNTER — Telehealth: Payer: Self-pay | Admitting: Hematology & Oncology

## 2011-06-08 DIAGNOSIS — C6290 Malignant neoplasm of unspecified testis, unspecified whether descended or undescended: Secondary | ICD-10-CM

## 2011-06-08 NOTE — Telephone Encounter (Signed)
Pt's phone is still busy

## 2011-06-08 NOTE — Telephone Encounter (Signed)
Pt called regarding financial assistance application, medicaid application, and disability application.  I mailed out all three to his home address.  Looked into drug replacement for pt's treatment.  Platinol and Vepesid are not replaceable.

## 2011-06-15 ENCOUNTER — Encounter (HOSPITAL_COMMUNITY): Payer: Self-pay

## 2011-06-15 ENCOUNTER — Other Ambulatory Visit: Payer: Self-pay | Admitting: Physician Assistant

## 2011-06-18 ENCOUNTER — Ambulatory Visit (HOSPITAL_COMMUNITY)
Admission: RE | Admit: 2011-06-18 | Discharge: 2011-06-18 | Disposition: A | Discharge status: Home / Self Care | Payer: Self-pay | Source: Ambulatory Visit | Attending: Hematology & Oncology | Admitting: Hematology & Oncology

## 2011-06-18 ENCOUNTER — Other Ambulatory Visit: Payer: Self-pay | Admitting: Hematology & Oncology

## 2011-06-18 DIAGNOSIS — C6290 Malignant neoplasm of unspecified testis, unspecified whether descended or undescended: Secondary | ICD-10-CM

## 2011-06-18 LAB — CBC
HCT: 49.6 % (ref 39.0–52.0)
MCH: 30.8 pg (ref 26.0–34.0)
MCV: 90.5 fL (ref 78.0–100.0)
Platelets: 164 10*3/uL (ref 150–400)
RBC: 5.48 MIL/uL (ref 4.22–5.81)

## 2011-06-18 MED ORDER — MIDAZOLAM HCL 5 MG/5ML IJ SOLN
INTRAMUSCULAR | Status: AC | PRN
  Administered 2011-06-18: 1 mg via INTRAVENOUS
  Administered 2011-06-18: 1.5 mg via INTRAVENOUS

## 2011-06-18 MED ORDER — FENTANYL CITRATE 0.05 MG/ML IJ SOLN
INTRAMUSCULAR | Status: AC
  Filled 2011-06-18: qty 4

## 2011-06-18 MED ORDER — MIDAZOLAM HCL 2 MG/2ML IJ SOLN
INTRAMUSCULAR | Status: AC
  Filled 2011-06-18: qty 2

## 2011-06-18 MED ORDER — SODIUM CHLORIDE 0.9 % IV SOLN
INTRAVENOUS | Status: DC
  Administered 2011-06-18: 10:00:00 via INTRAVENOUS

## 2011-06-18 MED ORDER — CEFAZOLIN SODIUM 1-5 GM-% IV SOLN: 1.0000 g | Freq: Once | INTRAVENOUS | Status: DC

## 2011-06-18 MED ORDER — CEFAZOLIN SODIUM-DEXTROSE 2-3 GM-% IV SOLR
INTRAVENOUS | Status: AC
  Administered 2011-06-18: 2000 mg
  Filled 2011-06-18: qty 50

## 2011-06-18 MED ORDER — FENTANYL CITRATE 0.05 MG/ML IJ SOLN
INTRAMUSCULAR | Status: AC | PRN
  Administered 2011-06-18: 100 ug via INTRAVENOUS

## 2011-06-18 MED ORDER — FENTANYL CITRATE 0.05 MG/ML IJ SOLN
INTRAMUSCULAR | Status: AC
  Filled 2011-06-18: qty 2

## 2011-06-18 MED ORDER — MIDAZOLAM HCL 2 MG/2ML IJ SOLN
INTRAMUSCULAR | Status: AC
  Filled 2011-06-18: qty 4

## 2011-06-18 MED ORDER — HEPARIN SODIUM (PORCINE) 1000 UNIT/ML IJ SOLN
INTRAMUSCULAR | Status: AC | PRN
  Administered 2011-06-18: 500 [IU]

## 2011-06-18 NOTE — H&P (Signed)
Devin Finley is an 31 y.o. male.   Chief Complaint:" I'm here for a port a cath" HPI: Patient with recurrent testicular seminoma presents today for port a cath placement for chemotherapy.  Past Medical History  Diagnosis Date  . Cancer     testicular seminoma  . Testicular seminoma 02/26/2011    PSH: left orchiectomy No family history on file. Social History:  does not have a smoking history on file. He does not have any smokeless tobacco history on file. His alcohol and drug histories not on file.  Allergies: No Known Allergies  No current outpatient prescriptions on file as of 06/18/2011.   Medications Prior to Admission  Medication Dose Route Frequency Provider Last Rate Last Dose  . 0.9 %  sodium chloride infusion   Intravenous Continuous Simonne Come, MD      . ceFAZolin (ANCEF) 2-3 GM-% IVPB SOLR           . ceFAZolin (ANCEF) IVPB 1 g/50 mL premix  1 g Intravenous Once Simonne Come, MD        Results for orders placed during the hospital encounter of 06/18/11 (from the past 48 hour(s))  APTT     Status: Normal   Collection Time   06/18/11  8:13 AM      Component Value Range Comment   aPTT 30  24 - 37 (seconds)   CBC     Status: Normal   Collection Time   06/18/11  8:13 AM      Component Value Range Comment   WBC 6.5  4.0 - 10.5 (K/uL)    RBC 5.48  4.22 - 5.81 (MIL/uL)    Hemoglobin 16.9  13.0 - 17.0 (g/dL)    HCT 40.9  81.1 - 91.4 (%)    MCV 90.5  78.0 - 100.0 (fL)    MCH 30.8  26.0 - 34.0 (pg)    MCHC 34.1  30.0 - 36.0 (g/dL)    RDW 78.2  95.6 - 21.3 (%)    Platelets 164  150 - 400 (K/uL)   PROTIME-INR     Status: Normal   Collection Time   06/18/11  8:13 AM      Component Value Range Comment   Prothrombin Time 13.2  11.6 - 15.2 (seconds)    INR 0.98  0.00 - 1.49     No results found.  Review of Systems  Constitutional: Negative for fever and chills.  Respiratory: Negative for cough and shortness of breath.   Cardiovascular: Negative for chest pain.    Gastrointestinal: Positive for abdominal pain. Negative for nausea and vomiting.  Genitourinary:       Mild left scrotal pain  Neurological: Negative for headaches.  Endo/Heme/Allergies: Does not bruise/bleed easily.    Blood pressure 132/81, pulse 63, temperature 97.6 F (36.4 C), temperature source Oral, resp. rate 14, height 6' (1.829 m), weight 231 lb (104.781 kg), SpO2 98.00%. Physical Exam  Constitutional: He is oriented to person, place, and time. He appears well-developed and well-nourished.  Cardiovascular: Normal rate and regular rhythm.   Respiratory: Effort normal and breath sounds normal.  GI: Soft. Bowel sounds are normal.  Musculoskeletal: Normal range of motion. He exhibits no edema.  Neurological: He is alert and oriented to person, place, and time.     Assessment/Plan Patient with recurrent testicular seminoma; plan is for port a cath placement for chemotherapy.  Quintara Bost,D KEVIN 06/18/2011, 9:13 AM

## 2011-06-18 NOTE — Discharge Instructions (Signed)
Implanted Port Instructions  An implanted port is a central line that has a round shape and is placed under the skin. It is used for long-term IV (intravenous) access for:   Medicine.   Fluids.   Liquid nutrition, such as TPN (total parenteral nutrition).   Blood samples.  Ports can be placed:   In the chest area just below the collarbone (this is the most common place.)   In the arms.   In the belly (abdomen) area.   In the legs.  PARTS OF THE PORT  A port has 2 main parts:   The reservoir. The reservoir is round, disc-shaped, and will be a small, raised area under your skin.   The reservoir is the part where a needle is inserted (accessed) to either give medicines or to draw blood.   The catheter. The catheter is a long, slender tube that extends from the reservoir. The catheter is placed into a large vein.   Medicine that is inserted into the reservoir goes into the catheter and then into the vein.  INSERTION OF THE PORT   The port is surgically placed in either an operating room or in a procedural area (interventional radiology).   Medicine may be given to help you relax during the procedure.   The skin where the port will be inserted is numbed (local anesthetic).   1 or 2 small cuts (incisions) will be made in the skin to insert the port.   The port can be used after it has been inserted.  INCISION SITE CARE   The incision site may have small adhesive strips on it. This helps keep the incision site closed. Sometimes, no adhesive strips are placed. Instead of adhesive strips, a special kind of surgical glue is used to keep the incision closed.   If adhesive strips were placed on the incision sites, do not take them off. They will fall off on their own.   The incision site may be sore for 1 to 2 days. Pain medicine can help.   Do not get the incision site wet. Bathe or shower as directed by your caregiver.   The incision site should heal in 5 to 7 days. A small scar may form after the  incision has healed.  ACCESSING THE PORT  Special steps must be taken to access the port:   Before the port is accessed, a numbing cream can be placed on the skin. This helps numb the skin over the port site.   A sterile technique is used to access the port.   The port is accessed with a needle. Only "non-coring" port needles should be used to access the port. Once the port is accessed, a blood return should be checked. This helps ensure the port is in the vein and is not clogged (clotted).   If your caregiver believes your port should remain accessed, a clear (transparent) bandage will be placed over the needle site. The bandage and needle will need to be changed every week or as directed by your caregiver.   Keep the bandage covering the needle clean and dry. Do not get it wet. Follow your caregiver's instructions on how to take a shower or bath when the port is accessed.   If your port does not need to stay accessed, no bandage is needed over the port.  FLUSHING THE PORT  Flushing the port keeps it from getting clogged. How often the port is flushed depends on:   If a   constant infusion is running. If a constant infusion is running, the port may not need to be flushed.   If intermittent medicines are given.   If the port is not being used.  For intermittent medicines:   The port will need to be flushed:   After medicines have been given.   After blood has been drawn.   As part of routine maintenance.   A port is normally flushed with:   Normal saline.   Heparin.   Follow your caregiver's advice on how often, how much, and the type of flush to use on your port.  IMPORTANT PORT INFORMATION   Tell your caregiver if you are allergic to heparin.   After your port is placed, you will get a manufacturer's information card. The card has information about your port. Keep this card with you at all times.   There are many types of ports available. Know what kind of port you have.   In case of an  emergency, it may be helpful to wear a medical alert bracelet. This can help alert health care workers that you have a port.   The port can stay in for as long as your caregiver believes it is necessary.   When it is time for the port to come out, surgery will be done to remove it. The surgery will be similar to how the port was put in.   If you are in the hospital or clinic:   Your port will be taken care of and flushed by a nurse.   If you are at home:   A home health care nurse may give medicines and take care of the port.   You or a family member can get special training and directions for giving medicine and taking care of the port at home.  SEEK IMMEDIATE MEDICAL CARE IF:    Your port does not flush or you are unable to get a blood return.   New drainage or pus is coming from the incision.   A bad smell is coming from the incision site.   You develop swelling or increased redness at the incision site.   You develop increased swelling or pain at the port site.   You develop swelling or pain in the surrounding skin near the port.   You have an oral temperature above 102 F (38.9 C), not controlled by medicine.  MAKE SURE YOU:    Understand these instructions.   Will watch your condition.   Will get help right away if you are not doing well or get worse.  Document Released: 03/09/2005 Document Revised: 02/26/2011 Document Reviewed: 05/31/2008  [employer] Patient Information 2012 [employer]          Moderate Sedation, Adult  Moderate sedation is given to help you relax or even sleep through a procedure. You may remain sleepy, be clumsy, or have poor balance for several hours following this procedure. Arrange for a responsible adult, family member, or friend to take you home. A responsible adult should stay with you for at least 24 hours or until the medicines have worn off.   Do not participate in any activities where you could become injured for the next 24 hours, or until you feel normal  again. Do not:   Drive.   Swim.   Ride a bicycle.   Operate heavy machinery.   Cook.   Use power tools.   Climb ladders.   Work at heights.   Do not   make important decisions or sign legal documents until you are improved.   Vomiting may occur if you eat too soon. When you can drink without vomiting, try water, juice, or soup. Try solid foods if you feel little or no nausea.   Only take over-the-counter or prescription medications for pain, discomfort, or fever as directed by your caregiver.If pain medications have been prescribed for you, ask your caregiver how soon it is safe to take them.   Make sure you and your family fully understands everything about the medication given to you. Make sure you understand what side effects may occur.   You should not drink alcohol, take sleeping pills, or medications that cause drowsiness for at least 24 hours.   If you smoke, do not smoke alone.   If you are feeling better, you may resume normal activities 24 hours after receiving sedation.   Keep all appointments as scheduled. Follow all instructions.   Ask questions if you do not understand.  SEEK MEDICAL CARE IF:    Your skin is pale or bluish in color.   You continue to feel sick to your stomach (nauseous) or throw up (vomit).   Your pain is getting worse and not helped by medication.   You have bleeding or swelling.   You are still sleepy or feeling clumsy after 24 hours.  SEEK IMMEDIATE MEDICAL CARE IF:    You develop a rash.   You have difficulty breathing.   You develop any type of allergic problem.   You have a fever.  Document Released: 12/02/2000 Document Revised: 02/26/2011 Document Reviewed: 04/25/2007  [employer] Patient Information 2012 [employer]

## 2011-06-18 NOTE — Procedures (Signed)
Successful placement of right internal approach port-a-cath with tip at superior aspect of the right atrium. The catheter is ready for immediate use. No immediate post procedural complications.

## 2011-06-22 ENCOUNTER — Other Ambulatory Visit: Payer: Self-pay | Admitting: *Deleted

## 2011-06-22 ENCOUNTER — Ambulatory Visit (HOSPITAL_BASED_OUTPATIENT_CLINIC_OR_DEPARTMENT_OTHER): Payer: Self-pay

## 2011-06-22 ENCOUNTER — Telehealth: Payer: Self-pay | Admitting: Hematology & Oncology

## 2011-06-22 VITALS — BP 138/82 | HR 75 | Temp 97.0°F

## 2011-06-22 DIAGNOSIS — Z5111 Encounter for antineoplastic chemotherapy: Secondary | ICD-10-CM

## 2011-06-22 DIAGNOSIS — C6290 Malignant neoplasm of unspecified testis, unspecified whether descended or undescended: Secondary | ICD-10-CM

## 2011-06-22 MED ORDER — SODIUM CHLORIDE 0.9 % IV SOLN
Freq: Once | INTRAVENOUS | Status: AC
  Administered 2011-06-22: 10:00:00 via INTRAVENOUS

## 2011-06-22 MED ORDER — SODIUM CHLORIDE 0.9 % IJ SOLN
10.0000 mL | INTRAMUSCULAR | Status: DC | PRN
  Administered 2011-06-22: 10 mL
  Filled 2011-06-22: qty 10

## 2011-06-22 MED ORDER — LORAZEPAM 1 MG PO TABS: 1.0000 mg | ORAL_TABLET | Freq: Three times a day (TID) | ORAL | Status: AC | PRN

## 2011-06-22 MED ORDER — HEPARIN SOD (PORK) LOCK FLUSH 100 UNIT/ML IV SOLN
500.0000 [IU] | Freq: Once | INTRAVENOUS | Status: AC | PRN
  Administered 2011-06-22: 500 [IU]
  Filled 2011-06-22: qty 5

## 2011-06-22 MED ORDER — DEXAMETHASONE SODIUM PHOSPHATE 4 MG/ML IJ SOLN
20.0000 mg | Freq: Once | INTRAMUSCULAR | Status: AC
  Administered 2011-06-22: 20 mg via INTRAVENOUS

## 2011-06-22 MED ORDER — DEXAMETHASONE 4 MG PO TABS: ORAL_TABLET | ORAL | Status: DC

## 2011-06-22 MED ORDER — POTASSIUM CHLORIDE 2 MEQ/ML IV SOLN
Freq: Once | INTRAVENOUS | Status: AC
  Administered 2011-06-22: 10:00:00 via INTRAVENOUS
  Filled 2011-06-22: qty 10

## 2011-06-22 MED ORDER — SODIUM CHLORIDE 0.9 % IV SOLN
100.0000 mg/m2 | Freq: Once | INTRAVENOUS | Status: AC
  Administered 2011-06-22: 230 mg via INTRAVENOUS
  Filled 2011-06-22: qty 11.5

## 2011-06-22 MED ORDER — ONDANSETRON 16 MG/50ML IVPB (CHCC)
16.0000 mg | Freq: Once | INTRAVENOUS | Status: AC
  Administered 2011-06-22: 16 mg via INTRAVENOUS

## 2011-06-22 MED ORDER — PROMETHAZINE HCL 12.5 MG PO TABS: ORAL_TABLET | ORAL | Status: DC

## 2011-06-22 MED ORDER — SODIUM CHLORIDE 0.9 % IV SOLN
20.0000 mg/m2 | Freq: Once | INTRAVENOUS | Status: AC
  Administered 2011-06-22: 46 mg via INTRAVENOUS
  Filled 2011-06-22: qty 46

## 2011-06-22 NOTE — Patient Instructions (Signed)
[facility] Discharge Instructions for Patients Receiving Chemotherapy  Today you received the following chemotherapy agents Cisplatin, Etoposide  To help prevent nausea and vomiting after your treatment, we encourage you to take your nausea medication:  Phenergan 12.5 mg: 1-2 tabs every 6 hours as needed  Ativan 1mg : apply 1 tab under your tongue every 8 hours as needed for moderate to severe nausea or vomiting, sleep, or anxiety  Decadron 4mg :  Take 2 tabs twice daily starting the day after chemo (Day 6 = 06/27/11) for 3 days. Repeat with each cycle.   Other Medications:  Colace - this is a stool softener. Take 100mg  capsule 2-12 times a day as needed. If you have to take more than 6 capsules of Colace a day call the Cancer Center.  Senna - this is a mild laxative used to treat mild constipation. May take 2 tabs by mouth daily or up to twice a day as needed for mild constipation  Milk of Magnesia - this is a laxative used to treat moderate to severe constipation. May take 2-4 tablespoons every 8 hours as needed. May increase to 8 tablespoons x 1 dose and if no bowel movement call the Cancer Center  Imodium - this is for diarrhea. Take 2 tabs after 1st loose stool and then 1 tab after each loose stool until you go a total of 12 hours without a loose stool. Call Cancer Center if loose stools continue.   EMLA Cream - Apply quarter size application to Port site 1 hour prior to chemotherapy. Do NOT rub in. Cover area with plastic wrap.    If you develop nausea and vomiting that is not controlled by your nausea medication, call the clinic. If it is after clinic hours your family physician or the after hours number for the clinic or go to the Emergency Department.   BELOW ARE SYMPTOMS THAT SHOULD BE REPORTED IMMEDIATELY:  *FEVER GREATER THAN 100.5 F  *CHILLS WITH OR WITHOUT FEVER  NAUSEA AND VOMITING THAT IS NOT CONTROLLED WITH YOUR NAUSEA MEDICATION  *UNUSUAL SHORTNESS OF  BREATH  *UNUSUAL BRUISING OR BLEEDING  TENDERNESS IN MOUTH AND THROAT WITH OR WITHOUT PRESENCE OF ULCERS  *URINARY PROBLEMS  *BOWEL PROBLEMS  UNUSUAL RASH Items with * indicate a potential emergency and should be followed up as soon as possible.  One of the nurses will contact you 24 hours after your treatment. Please let the nurse know about any problems that you may have experienced. Feel free to call the clinic you have any questions or concerns. The clinic phone number is [phone].   I have been informed and understand all the instructions given to me. I know to contact the clinic, my physician, or go to the Emergency Department if any problems should occur. I do not have any questions at this time, but understand that I may call the clinic during office hours   should I have any questions or need assistance in obtaining follow up care.    __________________________________________  _____________  __________ Signature of Patient or Authorized Representative            Date                   Time    __________________________________________ Nurse's Signature

## 2011-06-22 NOTE — Telephone Encounter (Signed)
Spoke to patient today during his infusion appt.  Inquired as to whether he had received the financial, disability, and medicaid applications mailed out to him. Pt confirmed he has received them, but has not yet completed them. Pt stated he "would bring it in tomorrow."

## 2011-06-23 ENCOUNTER — Ambulatory Visit (HOSPITAL_BASED_OUTPATIENT_CLINIC_OR_DEPARTMENT_OTHER): Payer: Self-pay

## 2011-06-23 VITALS — BP 136/83 | HR 84 | Temp 96.7°F

## 2011-06-23 DIAGNOSIS — Z5111 Encounter for antineoplastic chemotherapy: Secondary | ICD-10-CM

## 2011-06-23 DIAGNOSIS — C6290 Malignant neoplasm of unspecified testis, unspecified whether descended or undescended: Secondary | ICD-10-CM

## 2011-06-23 MED ORDER — SODIUM CHLORIDE 0.9 % IV SOLN
20.0000 mg/m2 | Freq: Once | INTRAVENOUS | Status: AC
  Administered 2011-06-23: 46 mg via INTRAVENOUS
  Filled 2011-06-23: qty 46

## 2011-06-23 MED ORDER — SODIUM CHLORIDE 0.9 % IJ SOLN
10.0000 mL | INTRAMUSCULAR | Status: DC | PRN
  Filled 2011-06-23: qty 10

## 2011-06-23 MED ORDER — ALTEPLASE 2 MG IJ SOLR
2.0000 mg | Freq: Once | INTRAMUSCULAR | Status: DC | PRN
  Filled 2011-06-23: qty 2

## 2011-06-23 MED ORDER — POTASSIUM CHLORIDE 2 MEQ/ML IV SOLN
Freq: Once | INTRAVENOUS | Status: AC
  Administered 2011-06-23: 10:00:00 via INTRAVENOUS
  Filled 2011-06-23: qty 10

## 2011-06-23 MED ORDER — SODIUM CHLORIDE 0.9 % IV SOLN
100.0000 mg/m2 | Freq: Once | INTRAVENOUS | Status: AC
  Administered 2011-06-23: 230 mg via INTRAVENOUS
  Filled 2011-06-23: qty 11.5

## 2011-06-23 MED ORDER — ONDANSETRON 16 MG/50ML IVPB (CHCC)
16.0000 mg | Freq: Once | INTRAVENOUS | Status: AC
  Administered 2011-06-23: 16 mg via INTRAVENOUS

## 2011-06-23 MED ORDER — DEXAMETHASONE SODIUM PHOSPHATE 4 MG/ML IJ SOLN
20.0000 mg | Freq: Once | INTRAMUSCULAR | Status: AC
  Administered 2011-06-23: 20 mg via INTRAVENOUS

## 2011-06-23 MED ORDER — SODIUM CHLORIDE 0.9 % IJ SOLN
3.0000 mL | INTRAMUSCULAR | Status: DC | PRN
  Filled 2011-06-23: qty 10

## 2011-06-23 MED ORDER — SODIUM CHLORIDE 0.9 % IV SOLN
Freq: Once | INTRAVENOUS | Status: AC
  Administered 2011-06-23: 09:00:00 via INTRAVENOUS

## 2011-06-23 MED ORDER — HEPARIN SOD (PORK) LOCK FLUSH 100 UNIT/ML IV SOLN
500.0000 [IU] | Freq: Once | INTRAVENOUS | Status: DC | PRN
  Filled 2011-06-23: qty 5

## 2011-06-23 MED ORDER — HEPARIN SOD (PORK) LOCK FLUSH 100 UNIT/ML IV SOLN
250.0000 [IU] | Freq: Once | INTRAVENOUS | Status: DC | PRN
  Filled 2011-06-23: qty 5

## 2011-06-23 NOTE — Progress Notes (Signed)
Pt's urinary output during chemo tx was 4,227ml of urine.

## 2011-06-24 ENCOUNTER — Encounter: Payer: Self-pay | Admitting: Hematology & Oncology

## 2011-06-24 ENCOUNTER — Ambulatory Visit (HOSPITAL_BASED_OUTPATIENT_CLINIC_OR_DEPARTMENT_OTHER): Payer: Self-pay

## 2011-06-24 VITALS — BP 138/88 | HR 77 | Temp 97.2°F

## 2011-06-24 DIAGNOSIS — Z5111 Encounter for antineoplastic chemotherapy: Secondary | ICD-10-CM

## 2011-06-24 DIAGNOSIS — C6290 Malignant neoplasm of unspecified testis, unspecified whether descended or undescended: Secondary | ICD-10-CM

## 2011-06-24 DIAGNOSIS — R112 Nausea with vomiting, unspecified: Secondary | ICD-10-CM

## 2011-06-24 MED ORDER — HEPARIN SOD (PORK) LOCK FLUSH 100 UNIT/ML IV SOLN
500.0000 [IU] | Freq: Once | INTRAVENOUS | Status: AC | PRN
  Administered 2011-06-24: 500 [IU]
  Filled 2011-06-24: qty 5

## 2011-06-24 MED ORDER — SODIUM CHLORIDE 0.9 % IV SOLN
20.0000 mg/m2 | Freq: Once | INTRAVENOUS | Status: AC
  Administered 2011-06-24: 46 mg via INTRAVENOUS
  Filled 2011-06-24: qty 46

## 2011-06-24 MED ORDER — SODIUM CHLORIDE 0.9 % IV SOLN
100.0000 mg/m2 | Freq: Once | INTRAVENOUS | Status: AC
  Administered 2011-06-24: 230 mg via INTRAVENOUS
  Filled 2011-06-24: qty 11.5

## 2011-06-24 MED ORDER — SODIUM CHLORIDE 0.9 % IV SOLN: Freq: Once | INTRAVENOUS | Status: DC

## 2011-06-24 MED ORDER — ONDANSETRON 16 MG/50ML IVPB (CHCC)
16.0000 mg | Freq: Once | INTRAVENOUS | Status: AC
  Administered 2011-06-24: 16 mg via INTRAVENOUS

## 2011-06-24 MED ORDER — LORAZEPAM 2 MG/ML IJ SOLN
1.0000 mg | Freq: Once | INTRAMUSCULAR | Status: AC
  Administered 2011-06-24: 1 mg via INTRAVENOUS

## 2011-06-24 MED ORDER — POTASSIUM CHLORIDE 2 MEQ/ML IV SOLN
Freq: Once | INTRAVENOUS | Status: AC
  Administered 2011-06-24: 09:00:00 via INTRAVENOUS
  Filled 2011-06-24: qty 10

## 2011-06-24 MED ORDER — SODIUM CHLORIDE 0.9 % IJ SOLN
10.0000 mL | INTRAMUSCULAR | Status: DC | PRN
  Administered 2011-06-24: 10 mL
  Filled 2011-06-24: qty 10

## 2011-06-24 MED ORDER — DEXAMETHASONE SODIUM PHOSPHATE 4 MG/ML IJ SOLN
20.0000 mg | Freq: Once | INTRAMUSCULAR | Status: AC
  Administered 2011-06-24: 20 mg via INTRAVENOUS

## 2011-06-25 ENCOUNTER — Ambulatory Visit (HOSPITAL_BASED_OUTPATIENT_CLINIC_OR_DEPARTMENT_OTHER): Payer: Self-pay

## 2011-06-25 ENCOUNTER — Ambulatory Visit: Payer: Self-pay

## 2011-06-25 VITALS — BP 132/86 | HR 73 | Temp 97.2°F

## 2011-06-25 DIAGNOSIS — C6290 Malignant neoplasm of unspecified testis, unspecified whether descended or undescended: Secondary | ICD-10-CM

## 2011-06-25 DIAGNOSIS — Z5111 Encounter for antineoplastic chemotherapy: Secondary | ICD-10-CM

## 2011-06-25 MED ORDER — SODIUM CHLORIDE 0.9 % IV SOLN
100.0000 mg/m2 | Freq: Once | INTRAVENOUS | Status: AC
  Administered 2011-06-25: 230 mg via INTRAVENOUS
  Filled 2011-06-25: qty 11.5

## 2011-06-25 MED ORDER — DEXAMETHASONE SODIUM PHOSPHATE 4 MG/ML IJ SOLN
20.0000 mg | Freq: Once | INTRAMUSCULAR | Status: AC
  Administered 2011-06-25: 20 mg via INTRAVENOUS

## 2011-06-25 MED ORDER — SODIUM CHLORIDE 0.9 % IJ SOLN
3.0000 mL | INTRAMUSCULAR | Status: DC | PRN
  Administered 2011-06-25: 3 mL via INTRAVENOUS
  Filled 2011-06-25: qty 10

## 2011-06-25 MED ORDER — SODIUM CHLORIDE 0.9 % IV SOLN
Freq: Once | INTRAVENOUS | Status: AC
  Administered 2011-06-25: 09:00:00 via INTRAVENOUS

## 2011-06-25 MED ORDER — HEPARIN SOD (PORK) LOCK FLUSH 100 UNIT/ML IV SOLN
500.0000 [IU] | Freq: Once | INTRAVENOUS | Status: AC | PRN
  Administered 2011-06-25: 500 [IU]
  Filled 2011-06-25: qty 5

## 2011-06-25 MED ORDER — POTASSIUM CHLORIDE 2 MEQ/ML IV SOLN
Freq: Once | INTRAVENOUS | Status: AC
  Administered 2011-06-25: 10:00:00 via INTRAVENOUS
  Filled 2011-06-25: qty 10

## 2011-06-25 MED ORDER — SODIUM CHLORIDE 0.9 % IV SOLN
20.0000 mg/m2 | Freq: Once | INTRAVENOUS | Status: AC
  Administered 2011-06-25: 46 mg via INTRAVENOUS
  Filled 2011-06-25: qty 46

## 2011-06-25 MED ORDER — ONDANSETRON 16 MG/50ML IVPB (CHCC)
16.0000 mg | Freq: Once | INTRAVENOUS | Status: AC
  Administered 2011-06-25: 16 mg via INTRAVENOUS

## 2011-06-25 NOTE — Patient Instructions (Signed)

## 2011-06-26 ENCOUNTER — Ambulatory Visit (HOSPITAL_BASED_OUTPATIENT_CLINIC_OR_DEPARTMENT_OTHER): Payer: Self-pay

## 2011-06-26 VITALS — BP 128/81 | HR 78 | Temp 97.2°F

## 2011-06-26 DIAGNOSIS — C6290 Malignant neoplasm of unspecified testis, unspecified whether descended or undescended: Secondary | ICD-10-CM

## 2011-06-26 DIAGNOSIS — Z5111 Encounter for antineoplastic chemotherapy: Secondary | ICD-10-CM

## 2011-06-26 MED ORDER — SODIUM CHLORIDE 0.9 % IV SOLN
100.0000 mg/m2 | Freq: Once | INTRAVENOUS | Status: AC
  Administered 2011-06-26: 230 mg via INTRAVENOUS
  Filled 2011-06-26: qty 11.5

## 2011-06-26 MED ORDER — ONDANSETRON 16 MG/50ML IVPB (CHCC): 16.0000 mg | Freq: Once | INTRAVENOUS | Status: DC

## 2011-06-26 MED ORDER — SODIUM CHLORIDE 0.9 % IV SOLN: Freq: Once | INTRAVENOUS | Status: DC

## 2011-06-26 MED ORDER — SODIUM CHLORIDE 0.9 % IV SOLN
20.0000 mg/m2 | Freq: Once | INTRAVENOUS | Status: AC
  Administered 2011-06-26: 46 mg via INTRAVENOUS
  Filled 2011-06-26: qty 46

## 2011-06-26 MED ORDER — DEXAMETHASONE SODIUM PHOSPHATE 4 MG/ML IJ SOLN
20.0000 mg | Freq: Once | INTRAMUSCULAR | Status: AC
  Administered 2011-06-26: 20 mg via INTRAVENOUS

## 2011-06-26 MED ORDER — SODIUM CHLORIDE 0.9 % IJ SOLN
10.0000 mL | INTRAMUSCULAR | Status: DC | PRN
  Administered 2011-06-26: 10 mL
  Filled 2011-06-26: qty 10

## 2011-06-26 MED ORDER — HEPARIN SOD (PORK) LOCK FLUSH 100 UNIT/ML IV SOLN
500.0000 [IU] | Freq: Once | INTRAVENOUS | Status: AC | PRN
  Administered 2011-06-26: 500 [IU]
  Filled 2011-06-26: qty 5

## 2011-06-26 MED ORDER — POTASSIUM CHLORIDE 2 MEQ/ML IV SOLN
Freq: Once | INTRAVENOUS | Status: AC
  Administered 2011-06-26: 10:00:00 via INTRAVENOUS
  Filled 2011-06-26: qty 10

## 2011-06-26 MED ORDER — PALONOSETRON HCL INJECTION 0.25 MG/5ML
0.2500 mg | Freq: Once | INTRAVENOUS | Status: AC
  Administered 2011-06-26: 0.25 mg via INTRAVENOUS

## 2011-07-13 ENCOUNTER — Other Ambulatory Visit (HOSPITAL_BASED_OUTPATIENT_CLINIC_OR_DEPARTMENT_OTHER): Payer: Self-pay | Admitting: Lab

## 2011-07-13 ENCOUNTER — Ambulatory Visit (HOSPITAL_BASED_OUTPATIENT_CLINIC_OR_DEPARTMENT_OTHER): Payer: Self-pay | Admitting: Hematology & Oncology

## 2011-07-13 ENCOUNTER — Ambulatory Visit (HOSPITAL_BASED_OUTPATIENT_CLINIC_OR_DEPARTMENT_OTHER): Payer: Self-pay

## 2011-07-13 VITALS — BP 132/78 | HR 82 | Temp 96.8°F | Ht 72.0 in | Wt 226.0 lb

## 2011-07-13 DIAGNOSIS — Z5111 Encounter for antineoplastic chemotherapy: Secondary | ICD-10-CM

## 2011-07-13 DIAGNOSIS — C6290 Malignant neoplasm of unspecified testis, unspecified whether descended or undescended: Secondary | ICD-10-CM

## 2011-07-13 DIAGNOSIS — D702 Other drug-induced agranulocytosis: Secondary | ICD-10-CM

## 2011-07-13 LAB — CBC WITH DIFFERENTIAL (CANCER CENTER ONLY)
Eosinophils Absolute: 0 10*3/uL (ref 0.0–0.5)
HGB: 14.5 g/dL (ref 13.0–17.1)
LYMPH#: 2.4 10*3/uL (ref 0.9–3.3)
MONO#: 0.5 10*3/uL (ref 0.1–0.9)
NEUT#: 0.5 10*3/uL — ABNORMAL LOW (ref 1.5–6.5)
Platelets: 205 10*3/uL (ref 145–400)
RBC: 4.58 10*6/uL (ref 4.20–5.70)
WBC: 3.5 10*3/uL — ABNORMAL LOW (ref 4.0–10.0)

## 2011-07-13 LAB — BASIC METABOLIC PANEL - CANCER CENTER ONLY
BUN, Bld: 18 mg/dL (ref 7–22)
Chloride: 98 mEq/L (ref 98–108)
Potassium: 4.4 mEq/L (ref 3.3–4.7)

## 2011-07-13 MED ORDER — POTASSIUM CHLORIDE 2 MEQ/ML IV SOLN
Freq: Once | INTRAVENOUS | Status: AC
  Administered 2011-07-13: 10:00:00 via INTRAVENOUS
  Filled 2011-07-13: qty 10

## 2011-07-13 MED ORDER — SODIUM CHLORIDE 0.9 % IV SOLN
100.0000 mg/m2 | Freq: Once | INTRAVENOUS | Status: AC
  Administered 2011-07-13: 230 mg via INTRAVENOUS
  Filled 2011-07-13: qty 11.5

## 2011-07-13 MED ORDER — HEPARIN SOD (PORK) LOCK FLUSH 100 UNIT/ML IV SOLN
500.0000 [IU] | Freq: Once | INTRAVENOUS | Status: AC | PRN
  Administered 2011-07-13: 500 [IU]
  Filled 2011-07-13: qty 5

## 2011-07-13 MED ORDER — SODIUM CHLORIDE 0.9 % IJ SOLN
10.0000 mL | INTRAMUSCULAR | Status: DC | PRN
  Administered 2011-07-13: 10 mL
  Filled 2011-07-13: qty 10

## 2011-07-13 MED ORDER — ONDANSETRON 16 MG/50ML IVPB (CHCC)
16.0000 mg | Freq: Once | INTRAVENOUS | Status: AC
  Administered 2011-07-13: 16 mg via INTRAVENOUS

## 2011-07-13 MED ORDER — DEXAMETHASONE SODIUM PHOSPHATE 4 MG/ML IJ SOLN
20.0000 mg | Freq: Once | INTRAMUSCULAR | Status: AC
  Administered 2011-07-13: 20 mg via INTRAVENOUS

## 2011-07-13 MED ORDER — SODIUM CHLORIDE 0.9 % IV SOLN
20.0000 mg/m2 | Freq: Once | INTRAVENOUS | Status: AC
  Administered 2011-07-13: 46 mg via INTRAVENOUS
  Filled 2011-07-13: qty 46

## 2011-07-13 MED ORDER — SODIUM CHLORIDE 0.9 % IV SOLN: Freq: Once | INTRAVENOUS | Status: DC

## 2011-07-13 NOTE — Progress Notes (Signed)
This office note has been dictated.

## 2011-07-14 ENCOUNTER — Other Ambulatory Visit: Payer: Self-pay | Admitting: Oncology

## 2011-07-14 ENCOUNTER — Ambulatory Visit (HOSPITAL_BASED_OUTPATIENT_CLINIC_OR_DEPARTMENT_OTHER): Payer: Self-pay

## 2011-07-14 VITALS — BP 151/98 | HR 85 | Temp 85.0°F

## 2011-07-14 DIAGNOSIS — Z5111 Encounter for antineoplastic chemotherapy: Secondary | ICD-10-CM

## 2011-07-14 DIAGNOSIS — C6290 Malignant neoplasm of unspecified testis, unspecified whether descended or undescended: Secondary | ICD-10-CM

## 2011-07-14 MED ORDER — PALONOSETRON HCL INJECTION 0.25 MG/5ML
0.2500 mg | Freq: Once | INTRAVENOUS | Status: AC
  Administered 2011-07-14: 0.25 mg via INTRAVENOUS

## 2011-07-14 MED ORDER — SODIUM CHLORIDE 0.9 % IJ SOLN
10.0000 mL | INTRAMUSCULAR | Status: DC | PRN
  Administered 2011-07-14: 10 mL
  Filled 2011-07-14: qty 10

## 2011-07-14 MED ORDER — FAMOTIDINE IN NACL 20-0.9 MG/50ML-% IV SOLN
40.0000 mg | Freq: Once | INTRAVENOUS | Status: AC
  Administered 2011-07-14: 40 mg via INTRAVENOUS

## 2011-07-14 MED ORDER — SODIUM CHLORIDE 0.9 % IV SOLN
100.0000 mg/m2 | Freq: Once | INTRAVENOUS | Status: AC
  Administered 2011-07-14: 230 mg via INTRAVENOUS
  Filled 2011-07-14: qty 11.5

## 2011-07-14 MED ORDER — HEPARIN SOD (PORK) LOCK FLUSH 100 UNIT/ML IV SOLN
500.0000 [IU] | Freq: Once | INTRAVENOUS | Status: AC | PRN
  Administered 2011-07-14: 500 [IU]
  Filled 2011-07-14: qty 5

## 2011-07-14 MED ORDER — SODIUM CHLORIDE 0.9 % IV SOLN
Freq: Once | INTRAVENOUS | Status: AC
  Administered 2011-07-14: 09:00:00 via INTRAVENOUS

## 2011-07-14 MED ORDER — SODIUM CHLORIDE 0.9 % IV SOLN
20.0000 mg/m2 | Freq: Once | INTRAVENOUS | Status: AC
  Administered 2011-07-14: 46 mg via INTRAVENOUS
  Filled 2011-07-14: qty 46

## 2011-07-14 MED ORDER — POTASSIUM CHLORIDE 2 MEQ/ML IV SOLN
Freq: Once | INTRAVENOUS | Status: AC
  Administered 2011-07-14: 09:00:00 via INTRAVENOUS
  Filled 2011-07-14: qty 10

## 2011-07-14 MED ORDER — DEXAMETHASONE SODIUM PHOSPHATE 4 MG/ML IJ SOLN
20.0000 mg | Freq: Once | INTRAMUSCULAR | Status: AC
  Administered 2011-07-14: 20 mg via INTRAVENOUS

## 2011-07-14 NOTE — Patient Instructions (Signed)

## 2011-07-14 NOTE — Progress Notes (Signed)
CC:   Devin Finley, M.D.  DIAGNOSIS:  Recurrent seminoma.  CURRENT THERAPY:  Status post cycle 1 of cisplatin/etoposide.  INTERVAL HISTORY:  Devin Finley comes in for his followup.  He did well with his 1st cycle of chemotherapy.  He did have some nausea for the first 3 days afterwards.  I will have to see about getting him Ativan. This, I think, may be a good idea for him.  Otherwise, he really tolerated chemotherapy well.  He says that the taste for food has changed, which is no surprise.  He did have some diarrhea.  Imodium seemed to help with this.  He is still working out.  He is cutting back on his workouts a little bit but he is happy that he is still able to go to the gym.  He has had no cough.  He has had no fever.  He has had no bleeding.  PHYSICAL EXAMINATION:  This is a well-developed well-nourished black gentleman in no obvious distress.  Vital signs:  96.8, pulse 82, respiratory rate 18, blood pressure 132/78.  Weight is 226.  Head and neck exam shows a normocephalic, atraumatic skull.  There are no ocular or oral lesions.  There are no palpable cervical or supraclavicular lymph nodes.  Lungs:  Clear bilaterally.  Cardiac:  Regular rate and rhythm with a normal S1 and S2.  There are no murmurs, rubs or bruits. Abdomen:  Soft with good bowel sounds.  There is no palpable abdominal mass.  There is no palpable hepatosplenomegaly.  He has a well-healed left orchiectomy scar.  Extremities: No clubbing, cyanosis or edema. Neurologic:  No focal neurological deficits.  Skin:  No rash, ecchymosis or petechia.  LABORATORY STUDIES:  White cell count is 3.5, hemoglobin 14.5, hematocrit 41.7, platelet count 205.  ANC is 500.  His monocytes are up to 14%, however.  IMPRESSION:  Devin Finley is a 31 year old black gentleman with recurrent seminoma.  His recurrence was about a year out from his adjuvant therapy.  Although he does have some neutropenia, his monocytes are up so I  feel that he is recovering with his bone marrow.  As such, I think that we have to treat him this week.  I believe that this will be best for him in the long run that we get his treatment on schedule.  We will go ahead and plan for a followup CT scan after this cycle of chemotherapy.  I want to make sure that we are seeing a good response, which I feel that we are.  Will plan to get him back in 3 weeks for his third cycle of chemo.  I still plan for 4 cycles.    ______________________________ Josph Macho, M.D. PRE/MEDQ  D:  07/13/2011  T:  07/14/2011  Job:  [id number]

## 2011-07-15 ENCOUNTER — Ambulatory Visit (HOSPITAL_BASED_OUTPATIENT_CLINIC_OR_DEPARTMENT_OTHER): Payer: Self-pay

## 2011-07-15 VITALS — BP 150/95 | HR 76 | Temp 97.0°F

## 2011-07-15 DIAGNOSIS — C6290 Malignant neoplasm of unspecified testis, unspecified whether descended or undescended: Secondary | ICD-10-CM

## 2011-07-15 DIAGNOSIS — Z5111 Encounter for antineoplastic chemotherapy: Secondary | ICD-10-CM

## 2011-07-15 MED ORDER — SODIUM CHLORIDE 0.9 % IV SOLN
100.0000 mg/m2 | Freq: Once | INTRAVENOUS | Status: AC
  Administered 2011-07-15: 230 mg via INTRAVENOUS
  Filled 2011-07-15: qty 11.5

## 2011-07-15 MED ORDER — POTASSIUM CHLORIDE 2 MEQ/ML IV SOLN
Freq: Once | INTRAVENOUS | Status: AC
  Administered 2011-07-15: 10:00:00 via INTRAVENOUS
  Filled 2011-07-15: qty 10

## 2011-07-15 MED ORDER — SODIUM CHLORIDE 0.9 % IV SOLN
Freq: Once | INTRAVENOUS | Status: AC
  Administered 2011-07-15: 10:00:00 via INTRAVENOUS

## 2011-07-15 MED ORDER — HEPARIN SOD (PORK) LOCK FLUSH 100 UNIT/ML IV SOLN
500.0000 [IU] | Freq: Once | INTRAVENOUS | Status: AC | PRN
  Administered 2011-07-15: 500 [IU]
  Filled 2011-07-15: qty 5

## 2011-07-15 MED ORDER — SODIUM CHLORIDE 0.9 % IV SOLN
20.0000 mg/m2 | Freq: Once | INTRAVENOUS | Status: AC
  Administered 2011-07-15: 46 mg via INTRAVENOUS
  Filled 2011-07-15: qty 46

## 2011-07-15 MED ORDER — DEXAMETHASONE SODIUM PHOSPHATE 4 MG/ML IJ SOLN
20.0000 mg | Freq: Once | INTRAMUSCULAR | Status: AC
  Administered 2011-07-15: 20 mg via INTRAVENOUS

## 2011-07-15 MED ORDER — SODIUM CHLORIDE 0.9 % IJ SOLN
10.0000 mL | INTRAMUSCULAR | Status: DC | PRN
  Administered 2011-07-15: 10 mL
  Filled 2011-07-15: qty 10

## 2011-07-15 NOTE — Patient Instructions (Signed)
[facility] Discharge Instructions for Patients Receiving Chemotherapy  Today you received the following chemotherapy agents VP16 and CDDP To help prevent nausea and vomiting after your treatment, we encourage you to take your nausea medication  If you develop nausea and vomiting that is not controlled by your nausea medication, call the clinic. If it is after clinic hours your family physician or the after hours number for the clinic or go to the Emergency Department.   BELOW ARE SYMPTOMS THAT SHOULD BE REPORTED IMMEDIATELY:  *FEVER GREATER THAN 100.5 F  *CHILLS WITH OR WITHOUT FEVER  NAUSEA AND VOMITING THAT IS NOT CONTROLLED WITH YOUR NAUSEA MEDICATION  *UNUSUAL SHORTNESS OF BREATH  *UNUSUAL BRUISING OR BLEEDING  TENDERNESS IN MOUTH AND THROAT WITH OR WITHOUT PRESENCE OF ULCERS  *URINARY PROBLEMS  *BOWEL PROBLEMS  UNUSUAL RASH Items with * indicate a potential emergency and should be followed up as soon as possible.  One of the nurses will contact you 24 hours after your treatment. Please let the nurse know about any problems that you may have experienced. Feel free to call the clinic you have any questions or concerns. The clinic phone number is [phone].   I have been informed and understand all the instructions given to me. I know to contact the clinic, my physician, or go to the Emergency Department if any problems should occur. I do not have any questions at this time, but understand that I may call the clinic during office hours   should I have any questions or need assistance in obtaining follow up care.    __________________________________________  _____________  __________ Signature of Patient or Authorized Representative            Date                   Time    __________________________________________ Nurse's Signature

## 2011-07-16 ENCOUNTER — Ambulatory Visit (HOSPITAL_BASED_OUTPATIENT_CLINIC_OR_DEPARTMENT_OTHER): Payer: Self-pay

## 2011-07-16 DIAGNOSIS — Z5111 Encounter for antineoplastic chemotherapy: Secondary | ICD-10-CM

## 2011-07-16 DIAGNOSIS — C6290 Malignant neoplasm of unspecified testis, unspecified whether descended or undescended: Secondary | ICD-10-CM

## 2011-07-16 LAB — AFP TUMOR MARKER: AFP-Tumor Marker: 4.5 ng/mL (ref 0.0–8.0)

## 2011-07-16 LAB — LACTATE DEHYDROGENASE: LDH: 207 U/L (ref 94–250)

## 2011-07-16 LAB — BETA HCG QUANT (REF LAB): Beta hCG, Tumor Marker: 0.5 m[IU]/mL (ref <5.0)

## 2011-07-16 MED ORDER — POTASSIUM CHLORIDE 2 MEQ/ML IV SOLN
Freq: Once | INTRAVENOUS | Status: AC
  Administered 2011-07-16: 10:00:00 via INTRAVENOUS
  Filled 2011-07-16: qty 10

## 2011-07-16 MED ORDER — SODIUM CHLORIDE 0.9 % IJ SOLN
10.0000 mL | INTRAMUSCULAR | Status: DC | PRN
  Administered 2011-07-16: 10 mL
  Filled 2011-07-16: qty 10

## 2011-07-16 MED ORDER — DEXAMETHASONE SODIUM PHOSPHATE 4 MG/ML IJ SOLN
20.0000 mg | Freq: Once | INTRAMUSCULAR | Status: AC
  Administered 2011-07-16: 20 mg via INTRAVENOUS

## 2011-07-16 MED ORDER — SODIUM CHLORIDE 0.9 % IV SOLN
100.0000 mg/m2 | Freq: Once | INTRAVENOUS | Status: AC
  Administered 2011-07-16: 230 mg via INTRAVENOUS
  Filled 2011-07-16: qty 11.5

## 2011-07-16 MED ORDER — SODIUM CHLORIDE 0.9 % IV SOLN
Freq: Once | INTRAVENOUS | Status: AC
  Administered 2011-07-16: 10:00:00 via INTRAVENOUS

## 2011-07-16 MED ORDER — SODIUM CHLORIDE 0.9 % IV SOLN
20.0000 mg/m2 | Freq: Once | INTRAVENOUS | Status: AC
  Administered 2011-07-16: 46 mg via INTRAVENOUS
  Filled 2011-07-16: qty 46

## 2011-07-16 MED ORDER — HEPARIN SOD (PORK) LOCK FLUSH 100 UNIT/ML IV SOLN
500.0000 [IU] | Freq: Once | INTRAVENOUS | Status: AC | PRN
  Administered 2011-07-16: 500 [IU]
  Filled 2011-07-16: qty 5

## 2011-07-16 NOTE — Patient Instructions (Signed)
[facility] Discharge Instructions for Patients Receiving Chemotherapy  Today you received the following chemotherapy agents CDDP and VP16 To help prevent nausea and vomiting after your treatment, we encourage you to take your nausea med.   If you develop nausea and vomiting that is not controlled by your nausea medication, call the clinic. If it is after clinic hours your family physician or the after hours number for the clinic or go to the Emergency Department.   BELOW ARE SYMPTOMS THAT SHOULD BE REPORTED IMMEDIATELY:  *FEVER GREATER THAN 100.5 F  *CHILLS WITH OR WITHOUT FEVER  NAUSEA AND VOMITING THAT IS NOT CONTROLLED WITH YOUR NAUSEA MEDICATION  *UNUSUAL SHORTNESS OF BREATH  *UNUSUAL BRUISING OR BLEEDING  TENDERNESS IN MOUTH AND THROAT WITH OR WITHOUT PRESENCE OF ULCERS  *URINARY PROBLEMS  *BOWEL PROBLEMS  UNUSUAL RASH Items with * indicate a potential emergency and should be followed up as soon as possible.  One of the nurses will contact you 24 hours after your treatment. Please let the nurse know about any problems that you may have experienced. Feel free to call the clinic you have any questions or concerns. The clinic phone number is [phone].   I have been informed and understand all the instructions given to me. I know to contact the clinic, my physician, or go to the Emergency Department if any problems should occur. I do not have any questions at this time, but understand that I may call the clinic during office hours   should I have any questions or need assistance in obtaining follow up care.    __________________________________________  _____________  __________ Signature of Patient or Authorized Representative            Date                   Time    __________________________________________ Nurse's Signature

## 2011-07-17 ENCOUNTER — Ambulatory Visit (HOSPITAL_BASED_OUTPATIENT_CLINIC_OR_DEPARTMENT_OTHER): Payer: Self-pay

## 2011-07-17 VITALS — BP 134/83 | HR 76 | Temp 96.7°F

## 2011-07-17 DIAGNOSIS — Z5111 Encounter for antineoplastic chemotherapy: Secondary | ICD-10-CM

## 2011-07-17 DIAGNOSIS — C6290 Malignant neoplasm of unspecified testis, unspecified whether descended or undescended: Secondary | ICD-10-CM

## 2011-07-17 MED ORDER — SODIUM CHLORIDE 0.9 % IJ SOLN
10.0000 mL | INTRAMUSCULAR | Status: DC | PRN
  Filled 2011-07-17: qty 10

## 2011-07-17 MED ORDER — PALONOSETRON HCL INJECTION 0.25 MG/5ML
0.2500 mg | Freq: Once | INTRAVENOUS | Status: AC
  Administered 2011-07-17: 0.25 mg via INTRAVENOUS

## 2011-07-17 MED ORDER — DEXAMETHASONE SODIUM PHOSPHATE 4 MG/ML IJ SOLN
20.0000 mg | Freq: Once | INTRAMUSCULAR | Status: AC
  Administered 2011-07-17: 20 mg via INTRAVENOUS

## 2011-07-17 MED ORDER — SODIUM CHLORIDE 0.9 % IV SOLN
20.0000 mg/m2 | Freq: Once | INTRAVENOUS | Status: AC
  Administered 2011-07-17: 46 mg via INTRAVENOUS
  Filled 2011-07-17: qty 46

## 2011-07-17 MED ORDER — HEPARIN SOD (PORK) LOCK FLUSH 100 UNIT/ML IV SOLN
500.0000 [IU] | Freq: Once | INTRAVENOUS | Status: DC | PRN
  Filled 2011-07-17: qty 5

## 2011-07-17 MED ORDER — POTASSIUM CHLORIDE 2 MEQ/ML IV SOLN
Freq: Once | INTRAVENOUS | Status: AC
  Administered 2011-07-17: 10:00:00 via INTRAVENOUS
  Filled 2011-07-17: qty 10

## 2011-07-17 MED ORDER — SODIUM CHLORIDE 0.9 % IV SOLN
100.0000 mg/m2 | Freq: Once | INTRAVENOUS | Status: AC
  Administered 2011-07-17: 230 mg via INTRAVENOUS
  Filled 2011-07-17: qty 11.5

## 2011-07-17 MED ORDER — SODIUM CHLORIDE 0.9 % IV SOLN
Freq: Once | INTRAVENOUS | Status: AC
  Administered 2011-07-17: 10:00:00 via INTRAVENOUS

## 2011-07-17 NOTE — Patient Instructions (Signed)
[facility] Discharge Instructions for Patients Receiving Chemotherapy  Today you received the following chemotherapy agents CDDP VP 16 To help prevent nausea and vomiting after your treatment, we encourage you to take your nausea medication  If you develop nausea and vomiting that is not controlled by your nausea medication, call the clinic. If it is after clinic hours your family physician or the after hours number for the clinic or go to the Emergency Department.   BELOW ARE SYMPTOMS THAT SHOULD BE REPORTED IMMEDIATELY:  *FEVER GREATER THAN 100.5 F  *CHILLS WITH OR WITHOUT FEVER  NAUSEA AND VOMITING THAT IS NOT CONTROLLED WITH YOUR NAUSEA MEDICATION  *UNUSUAL SHORTNESS OF BREATH  *UNUSUAL BRUISING OR BLEEDING  TENDERNESS IN MOUTH AND THROAT WITH OR WITHOUT PRESENCE OF ULCERS  *URINARY PROBLEMS  *BOWEL PROBLEMS  UNUSUAL RASH Items with * indicate a potential emergency and should be followed up as soon as possible.  One of the nurses will contact you 24 hours after your treatment. Please let the nurse know about any problems that you may have experienced. Feel free to call the clinic you have any questions or concerns. The clinic phone number is [phone].   I have been informed and understand all the instructions given to me. I know to contact the clinic, my physician, or go to the Emergency Department if any problems should occur. I do not have any questions at this time, but understand that I may call the clinic during office hours   should I have any questions or need assistance in obtaining follow up care.    __________________________________________  _____________  __________ Signature of Patient or Authorized Representative            Date                   Time    __________________________________________ Nurse's Signature

## 2011-07-30 ENCOUNTER — Ambulatory Visit: Payer: Self-pay | Admitting: Hematology & Oncology

## 2011-07-30 ENCOUNTER — Ambulatory Visit (HOSPITAL_BASED_OUTPATIENT_CLINIC_OR_DEPARTMENT_OTHER)
Admission: RE | Admit: 2011-07-30 | Discharge: 2011-07-30 | Disposition: A | Discharge status: Home / Self Care | Payer: Self-pay | Source: Ambulatory Visit | Attending: Hematology & Oncology | Admitting: Hematology & Oncology

## 2011-07-30 ENCOUNTER — Inpatient Hospital Stay (HOSPITAL_COMMUNITY)
Admission: EM | Admit: 2011-07-30 | Discharge: 2011-08-03 | DRG: 176 | Disposition: A | Discharge status: Home / Self Care | Payer: MEDICAID | Source: Ambulatory Visit | Attending: Internal Medicine | Admitting: Internal Medicine

## 2011-07-30 ENCOUNTER — Encounter (HOSPITAL_COMMUNITY): Payer: Self-pay | Admitting: Emergency Medicine

## 2011-07-30 ENCOUNTER — Telehealth: Payer: Self-pay | Admitting: *Deleted

## 2011-07-30 DIAGNOSIS — Z79899 Other long term (current) drug therapy: Secondary | ICD-10-CM

## 2011-07-30 DIAGNOSIS — Z09 Encounter for follow-up examination after completed treatment for conditions other than malignant neoplasm: Secondary | ICD-10-CM | POA: Insufficient documentation

## 2011-07-30 DIAGNOSIS — I2699 Other pulmonary embolism without acute cor pulmonale: Secondary | ICD-10-CM

## 2011-07-30 DIAGNOSIS — C6290 Malignant neoplasm of unspecified testis, unspecified whether descended or undescended: Secondary | ICD-10-CM

## 2011-07-30 DIAGNOSIS — R160 Hepatomegaly, not elsewhere classified: Secondary | ICD-10-CM | POA: Insufficient documentation

## 2011-07-30 DIAGNOSIS — R599 Enlarged lymph nodes, unspecified: Secondary | ICD-10-CM | POA: Insufficient documentation

## 2011-07-30 DIAGNOSIS — R911 Solitary pulmonary nodule: Secondary | ICD-10-CM | POA: Insufficient documentation

## 2011-07-30 DIAGNOSIS — N62 Hypertrophy of breast: Secondary | ICD-10-CM | POA: Insufficient documentation

## 2011-07-30 DIAGNOSIS — Z7901 Long term (current) use of anticoagulants: Secondary | ICD-10-CM

## 2011-07-30 HISTORY — DX: Malignant neoplasm of unspecified testis, unspecified whether descended or undescended: C62.90

## 2011-07-30 LAB — CBC
HCT: 39.4 % (ref 39.0–52.0)
Hemoglobin: 13.8 g/dL (ref 13.0–17.0)
MCH: 31.1 pg (ref 26.0–34.0)
MCHC: 35 g/dL (ref 30.0–36.0)
MCV: 88.7 fL (ref 78.0–100.0)
Platelets: 119 10*3/uL — ABNORMAL LOW (ref 150–400)
RBC: 4.44 MIL/uL (ref 4.22–5.81)
RDW: 13.7 % (ref 11.5–15.5)
WBC: 3.4 10*3/uL — ABNORMAL LOW (ref 4.0–10.5)

## 2011-07-30 LAB — POCT I-STAT, CHEM 8
BUN: 19 mg/dL (ref 6–23)
Calcium, Ion: 1.23 mmol/L (ref 1.12–1.32)
Chloride: 104 mEq/L (ref 96–112)
Creatinine, Ser: 1 mg/dL (ref 0.50–1.35)
Glucose, Bld: 94 mg/dL (ref 70–99)
TCO2: 29 mmol/L (ref 0–100)

## 2011-07-30 LAB — BASIC METABOLIC PANEL
BUN: 26 mg/dL — ABNORMAL HIGH (ref 6–23)
CO2: 26 mEq/L (ref 19–32)
Calcium: 9.4 mg/dL (ref 8.4–10.5)
Chloride: 105 mEq/L (ref 96–112)
Creatinine, Ser: 1.16 mg/dL (ref 0.50–1.35)
GFR calc Af Amer: >90 mL/min (ref >90)
GFR calc non Af Amer: 84 mL/min — ABNORMAL LOW (ref >90)
Glucose, Bld: 83 mg/dL (ref 70–99)
Potassium: 4.7 mEq/L (ref 3.5–5.1)
Sodium: 139 mEq/L (ref 135–145)

## 2011-07-30 LAB — PROTIME-INR
INR: 0.83 (ref 0.00–1.49)
Prothrombin Time: 11.6 seconds (ref 11.6–15.2)

## 2011-07-30 LAB — APTT: aPTT: 31 seconds (ref 24–37)

## 2011-07-30 MED ORDER — ENOXAPARIN SODIUM 150 MG/ML ~~LOC~~ SOLN
100.0000 mg | Freq: Once | SUBCUTANEOUS | Status: DC
  Administered 2011-07-30: 100 mg via SUBCUTANEOUS
  Filled 2011-07-30: qty 1

## 2011-07-30 MED ORDER — ENOXAPARIN SODIUM 100 MG/ML ~~LOC~~ SOLN
100.0000 mg | Freq: Once | SUBCUTANEOUS | Status: AC
  Filled 2011-07-30: qty 1

## 2011-07-30 MED ORDER — IOHEXOL 300 MG/ML  SOLN: 100.0000 mL | Freq: Once | INTRAMUSCULAR | Status: DC | PRN

## 2011-07-30 NOTE — Telephone Encounter (Signed)
Unable to reach pt x 2 therefore called emergency contact Adam. He will call Isayah to ask him to come into the office ASAP to have his PE treated.

## 2011-07-30 NOTE — ED Notes (Signed)
NO PERIPHERAL IV AT ARRIVAL.  

## 2011-07-30 NOTE — ED Notes (Signed)
Pt states he is being treated for testicular cancer and had a routine scheduled CT done today. Pt states he was called later and told he had a blood clot in his lung but he does not know which side. Pt denies any SOB or pain.

## 2011-07-30 NOTE — ED Notes (Signed)
PT. RECEIVED A CALL FROM HIS PCP THAT HE HAS " BLOOD CLOT IN MY LUNGS " ADVISED TO GO TO ER ASAP  , STATES CT ABDOMEN / LEFT GROIN  AT [facility] TODAY . DENIES SOB OR ANY DISCOMFORT.

## 2011-07-30 NOTE — ED Provider Notes (Signed)
History     CSN: [id number]  Arrival date & time 07/30/11  4098   First MD Initiated Contact with Patient 07/30/11 1940      No chief complaint on file.   (Consider location/radiation/quality/duration/timing/severity/associated sxs/prior treatment) HPI Patient sent to the emergency department following an abnormal result on a CT scan done earlier today.  Patient states the chest CT was done to evaluate for metastatic disease from his testicular cancer and it was noted that he had a pulmonary embolism.  Patient denies chest pain, shortness of breath, weakness, hemoptysis, vomiting, nausea, fever, or syncope.  Patient states that at this time, he continues to be asymptomatic.  Past Medical History  Diagnosis Date  . Cancer     testicular seminoma  . 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 Testicular cancer     Past Surgical History  Procedure Date  . Surgery scrotal / testicular     No family history on file.  History  Substance Use Topics  . Smoking status: Never Smoker   . Smokeless tobacco: Not on file  . Alcohol Use: No      Review of Systems All other systems negative except as documented in the HPI. All pertinent positives and negatives as reviewed in the HPI.  Allergies  Review of patient's allergies indicates no known allergies.  Home Medications   Current Outpatient Rx  Name Route Sig Dispense Refill  . DEXAMETHASONE 4 MG PO TABS  Take 2 tabs twice daily starting the day after chemo (day 6) for 3 days with food. Repeat with each cycle of chemo. 60 tablet 0    BP 124/97  Pulse 96  Temp(Src) 99 F (37.2 C) (Oral)  Resp 19  SpO2 100%  Physical Exam Physical Examination: General appearance - alert, well appearing, and in no distress, oriented to person, place, and time and overweight Mental status - alert, oriented to person, place, and time, normal mood, behavior, speech, dress, motor activity, and thought processes Eyes - pupils equal and reactive,  extraocular eye movements intact Ears - bilateral TM's and external ear canals normal Nose - normal and patent, no erythema, discharge or polyps Mouth - mucous membranes moist, pharynx normal without lesions Chest - clear to auscultation, no wheezes, rales or rhonchi, symmetric air entry, no tachypnea, retractions or cyanosis Heart - normal rate, regular rhythm, normal S1, S2, no murmurs, rubs, clicks or gallops Skin - normal coloration and turgor, no rashes, no suspicious skin lesions noted  ED Course  Procedures (including critical care time)  Labs Reviewed  CBC - Abnormal; Notable for the following:    WBC 3.4 (*)    Platelets 119 (*)    All other components within normal limits  BASIC METABOLIC PANEL - Abnormal; Notable for the following:    BUN 26 (*)    GFR calc non Af Amer 84 (*)    All other components within normal limits  PROTIME-INR  APTT   Ct Chest W Contrast  07/30/2011  *RADIOLOGY REPORT*  Clinical Data:  Status post chemotherapy for testicular cancer. Chronic left-sided pain.  Surgery 05/21/2010.  Began chemotherapy in April of this year.  CT CHEST, ABDOMEN AND PELVIS WITH CONTRAST  Technique: Contiguous axial images of the chest abdomen and pelvis were obtained after IV contrast administration.  Contrast: 100  ml Omnipaque-300  Comparison: 05/21/2011  CT CHEST  Findings: Lung windows demonstrate minimal subpleural nodularity in the right upper lobe on image 18, unchanged.  Minimal scarring or atelectasis at the  right lung base, unchanged.  Soft tissue windows demonstrate no supraclavicular adenopathy.  A right-sided Port-A-Cath terminates at the superior caval/atrial junction.  There is right greater than left bilateral gynecomastia. Normal heart size without pericardial or pleural effusion.  Filling defect within right lower lobe pulmonary arterial tree.  Example images 28 - 30 of series 2. Confirmed on sagittal image 46 and coronal image 55.  New.  No mediastinal or hilar  adenopathy.  IMPRESSION:  1.  No  evidence of metastatic disease in the chest. 2.  Right-sided pulmonary emboli on this non dedicated study. These results were called by telephone on 07/30/2011  at  12:53 p.m. to Amy, r.n., who verbally acknowledged these results. 3.  Right greater than left gynecomastia, as before.  CT ABDOMEN AND PELVIS  Findings:  Hepatomegaly, 20 cm.  Suspect mild hepatic steatosis. No focal liver lesion.  Normal spleen, stomach, pancreas, gallbladder, biliary tract, adrenal glands, kidneys.  Improvement to resolution of retroperitoneal adenopathy. - A retroperitoneal nodes just inferior to the left renal vein in the left para-aortic station measures 8 mm today versus 2.3 x 2.0 cm on the prior. - Immediately inferior left para-aortic node measures 1.0 cm on image 80 versus 1.1 cm on the prior.  The adjacent mesenteric node is resolved.  No retrocrural adenopathy.  Normal colon, appendix, and terminal ileum.  Normal small bowel without abdominal ascites.  Inguinal nodes are stable and not pathologic by size criteria. No pelvic adenopathy.    Normal urinary bladder and prostate.  No significant free fluid.  Left-sided testicular prosthesis is incompletely imaged.  Similar probable bone island in the right femoral neck.  Similar sclerotic focus within the right iliac bone.  IMPRESSION:  1.  Response to therapy of retroperitoneal and posterior mesenteric adenopathy.  The largest left para-aortic node is now borderline pathologic by size criteria. 2.  No new or progressive disease. 3.  Hepatomegaly and suspicion of mild hepatic steatosis.  Original Report Authenticated By: Consuello Bossier, M.D.   Ct Abdomen Pelvis W Contrast  07/30/2011  *RADIOLOGY REPORT*  Clinical Data:  Status post chemotherapy for testicular cancer. Chronic left-sided pain.  Surgery 05/21/2010.  Began chemotherapy in April of this year.  CT CHEST, ABDOMEN AND PELVIS WITH CONTRAST  Technique: Contiguous axial images of the chest  abdomen and pelvis were obtained after IV contrast administration.  Contrast: 100  ml Omnipaque-300  Comparison: 05/21/2011  CT CHEST  Findings: Lung windows demonstrate minimal subpleural nodularity in the right upper lobe on image 18, unchanged.  Minimal scarring or atelectasis at the right lung base, unchanged.  Soft tissue windows demonstrate no supraclavicular adenopathy.  A right-sided Port-A-Cath terminates at the superior caval/atrial junction.  There is right greater than left bilateral gynecomastia. Normal heart size without pericardial or pleural effusion.  Filling defect within right lower lobe pulmonary arterial tree.  Example images 28 - 30 of series 2. Confirmed on sagittal image 46 and coronal image 55.  New.  No mediastinal or hilar adenopathy.  IMPRESSION:  1.  No  evidence of metastatic disease in the chest. 2.  Right-sided pulmonary emboli on this non dedicated study. These results were called by telephone on 07/30/2011  at  12:53 p.m. to Amy, r.n., who verbally acknowledged these results. 3.  Right greater than left gynecomastia, as before.  CT ABDOMEN AND PELVIS  Findings:  Hepatomegaly, 20 cm.  Suspect mild hepatic steatosis. No focal liver lesion.  Normal spleen, stomach, pancreas, gallbladder, biliary tract, adrenal  glands, kidneys.  Improvement to resolution of retroperitoneal adenopathy. - A retroperitoneal nodes just inferior to the left renal vein in the left para-aortic station measures 8 mm today versus 2.3 x 2.0 cm on the prior. - Immediately inferior left para-aortic node measures 1.0 cm on image 80 versus 1.1 cm on the prior.  The adjacent mesenteric node is resolved.  No retrocrural adenopathy.  Normal colon, appendix, and terminal ileum.  Normal small bowel without abdominal ascites.  Inguinal nodes are stable and not pathologic by size criteria. No pelvic adenopathy.    Normal urinary bladder and prostate.  No significant free fluid.  Left-sided testicular prosthesis is  incompletely imaged.  Similar probable bone island in the right femoral neck.  Similar sclerotic focus within the right iliac bone.  IMPRESSION:  1.  Response to therapy of retroperitoneal and posterior mesenteric adenopathy.  The largest left para-aortic node is now borderline pathologic by size criteria. 2.  No new or progressive disease. 3.  Hepatomegaly and suspicion of mild hepatic steatosis.  Original Report Authenticated By: Consuello Bossier, M.D.   Patient admitted to the hospital by the [facility] for pulmonary embolus.  Patient has remained stable here in the emergency room.     MDM   MDM Reviewed: nursing note and vitals Interpretation: labs and CT scan Consults: admitting MD           Carlyle Dolly, PA-C 07/31/11 4098

## 2011-07-30 NOTE — Telephone Encounter (Signed)
Received a call from the radiologist stating the pt has a right PE based on his CT Scan from today. Informed Dr Myna Hidalgo who asked that the pt be contacted to come into the office today. Called the pt, no answer, left a message on his voicemail asking him to call the office as soon as he receives the message.

## 2011-07-31 ENCOUNTER — Encounter (HOSPITAL_COMMUNITY): Payer: Self-pay | Admitting: Internal Medicine

## 2011-07-31 DIAGNOSIS — I2699 Other pulmonary embolism without acute cor pulmonale: Secondary | ICD-10-CM

## 2011-07-31 DIAGNOSIS — C6290 Malignant neoplasm of unspecified testis, unspecified whether descended or undescended: Secondary | ICD-10-CM

## 2011-07-31 LAB — CBC
Hemoglobin: 12.8 g/dL — ABNORMAL LOW (ref 13.0–17.0)
Platelets: 118 10*3/uL — ABNORMAL LOW (ref 150–400)
RBC: 4.2 MIL/uL — ABNORMAL LOW (ref 4.22–5.81)
WBC: 3.2 10*3/uL — ABNORMAL LOW (ref 4.0–10.5)

## 2011-07-31 LAB — COMPREHENSIVE METABOLIC PANEL
ALT: 43 U/L (ref 0–53)
BUN: 24 mg/dL — ABNORMAL HIGH (ref 6–23)
CO2: 26 mEq/L (ref 19–32)
Calcium: 8.9 mg/dL (ref 8.4–10.5)
Creatinine, Ser: 0.98 mg/dL (ref 0.50–1.35)
GFR calc Af Amer: >90 mL/min (ref >90)
GFR calc non Af Amer: >90 mL/min (ref >90)
Glucose, Bld: 125 mg/dL — ABNORMAL HIGH (ref 70–99)

## 2011-07-31 MED ORDER — SODIUM CHLORIDE 0.9 % IJ SOLN
3.0000 mL | Freq: Two times a day (BID) | INTRAMUSCULAR | Status: DC
  Administered 2011-08-01: 3 mL via INTRAVENOUS
  Administered 2011-08-02: 22:00:00 via INTRAVENOUS

## 2011-07-31 MED ORDER — ACETAMINOPHEN 325 MG PO TABS: 650.0000 mg | ORAL_TABLET | Freq: Four times a day (QID) | ORAL | Status: DC | PRN

## 2011-07-31 MED ORDER — SODIUM CHLORIDE 0.9 % IJ SOLN
10.0000 mL | INTRAMUSCULAR | Status: DC | PRN
  Administered 2011-07-31: 30 mL
  Administered 2011-08-01 – 2011-08-03 (×3): 10 mL

## 2011-07-31 MED ORDER — WARFARIN VIDEO
Freq: Once | Status: AC
  Administered 2011-07-31: 13:00:00

## 2011-07-31 MED ORDER — ONDANSETRON HCL 4 MG PO TABS: 4.0000 mg | ORAL_TABLET | Freq: Four times a day (QID) | ORAL | Status: DC | PRN

## 2011-07-31 MED ORDER — ENOXAPARIN SODIUM 100 MG/ML ~~LOC~~ SOLN
100.0000 mg | Freq: Two times a day (BID) | SUBCUTANEOUS | Status: DC
  Administered 2011-07-31 – 2011-08-03 (×8): 100 mg via SUBCUTANEOUS
  Filled 2011-07-31 (×9): qty 1

## 2011-07-31 MED ORDER — WARFARIN - PHARMACIST DOSING INPATIENT: Freq: Every day | Status: DC

## 2011-07-31 MED ORDER — SODIUM CHLORIDE 0.9 % IV SOLN
INTRAVENOUS | Status: DC
  Administered 2011-07-31: 04:00:00 via INTRAVENOUS

## 2011-07-31 MED ORDER — ACETAMINOPHEN 650 MG RE SUPP: 650.0000 mg | Freq: Four times a day (QID) | RECTAL | Status: DC | PRN

## 2011-07-31 MED ORDER — COUMADIN BOOK
Freq: Once | Status: AC
  Administered 2011-07-31: 13:00:00
  Filled 2011-07-31 (×2): qty 1

## 2011-07-31 MED ORDER — WARFARIN SODIUM 10 MG PO TABS
10.0000 mg | ORAL_TABLET | Freq: Once | ORAL | Status: AC
  Administered 2011-07-31: 10 mg via ORAL
  Filled 2011-07-31: qty 1

## 2011-07-31 MED ORDER — ONDANSETRON HCL 4 MG/2ML IJ SOLN: 4.0000 mg | Freq: Four times a day (QID) | INTRAMUSCULAR | Status: DC | PRN

## 2011-07-31 NOTE — H&P (Signed)
Devin Finley is an 31 y.o. male.   PCP/Oncologist - Dr.Peter Twanna Hy. Chief Complaint: Was told to come to the ER because of abnormal CT findings. 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 Patient otherwise denies any chest pain, shortness of breath, palpitations or dizziness. At this time patient has been started on Lovenox for pulmonary embolism and will be observed overnight.  Past Medical History  Diagnosis Date  . Cancer     testicular seminoma  . 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 Testicular cancer     Past Surgical History  Procedure Date  . Surgery scrotal / testicular     History reviewed. No pertinent family history. Social History:  reports that he has never smoked. He does not have any smokeless tobacco history on file. He reports that he does not drink alcohol or use illicit drugs.  Allergies: No Known Allergies   (Not in a hospital admission)  Results for orders placed during the hospital encounter of 07/30/11 (from the past 48 hour(s))  CBC     Status: Abnormal   Collection Time   07/30/11  9:04 PM      Component Value Range Comment   WBC 3.4 (*) 4.0 - 10.5 (K/uL)    RBC 4.44  4.22 - 5.81 (MIL/uL)    Hemoglobin 13.8  13.0 - 17.0 (g/dL)    HCT 16.1  09.6 - 04.5 (%)    MCV 88.7  78.0 - 100.0 (fL)    MCH 31.1  26.0 - 34.0 (pg)    MCHC 35.0  30.0 - 36.0 (g/dL)    RDW 40.9  81.1 - 91.4 (%)    Platelets 119 (*) 150 - 400 (K/uL)   BASIC METABOLIC PANEL     Status: Abnormal   Collection Time   07/30/11  9:04 PM      Component Value Range Comment   Sodium 139  135 - 145 (mEq/L)    Potassium 4.7  3.5 - 5.1 (mEq/L)    Chloride 105  96 - 112 (mEq/L)    CO2 26  19 - 32 (mEq/L)    Glucose, Bld 83  70 - 99 (mg/dL)    BUN 26 (*) 6 - 23 (mg/dL)    Creatinine, Ser 7.82  0.50 - 1.35  (mg/dL)    Calcium 9.4  8.4 - 10.5 (mg/dL)    GFR calc non Af Amer 84 (*) >90 (mL/min)    GFR calc Af Amer >90  >90 (mL/min)   PROTIME-INR     Status: Normal   Collection Time   07/30/11  9:04 PM      Component Value Range Comment   Prothrombin Time 11.6  11.6 - 15.2 (seconds)    INR 0.83  0.00 - 1.49    APTT     Status: Normal   Collection Time   07/30/11  9:04 PM      Component Value Range Comment   aPTT 31  24 - 37 (seconds)   POCT I-STAT, CHEM 8     Status: Abnormal   Collection Time   07/30/11 11:34 PM      Component Value Range Comment   Sodium 143  135 - 145 (mEq/L)    Potassium 3.4 (*) 3.5 - 5.1 (mEq/L)    Chloride 104  96 -  112 (mEq/L)    BUN 19  6 - 23 (mg/dL)    Creatinine, Ser 1.61  0.50 - 1.35 (mg/dL)    Glucose, Bld 94  70 - 99 (mg/dL)    Calcium, Ion 0.96  1.12 - 1.32 (mmol/L)    TCO2 29  0 - 100 (mmol/L)    Hemoglobin 12.6 (*) 13.0 - 17.0 (g/dL)    HCT 04.5 (*) 40.9 - 52.0 (%)    Ct Chest W Contrast  07/30/2011  *RADIOLOGY REPORT*  Clinical Data:  Status post chemotherapy for testicular cancer. Chronic left-sided pain.  Surgery 05/21/2010.  Began chemotherapy in April of this year.  CT CHEST, ABDOMEN AND PELVIS WITH CONTRAST  Technique: Contiguous axial images of the chest abdomen and pelvis were obtained after IV contrast administration.  Contrast: 100  ml Omnipaque-300  Comparison: 05/21/2011  CT CHEST  Findings: Lung windows demonstrate minimal subpleural nodularity in the right upper lobe on image 18, unchanged.  Minimal scarring or atelectasis at the right lung base, unchanged.  Soft tissue windows demonstrate no supraclavicular adenopathy.  A right-sided Port-A-Cath terminates at the superior caval/atrial junction.  There is right greater than left bilateral gynecomastia. Normal heart size without pericardial or pleural effusion.  Filling defect within right lower lobe pulmonary arterial tree.  Example images 28 - 30 of series 2. Confirmed on sagittal image 46 and  coronal image 55.  New.  No mediastinal or hilar adenopathy.  IMPRESSION:  1.  No  evidence of metastatic disease in the chest. 2.  Right-sided pulmonary emboli on this non dedicated study. These results were called by telephone on 07/30/2011  at  12:53 p.m. to Amy, r.n., who verbally acknowledged these results. 3.  Right greater than left gynecomastia, as before.  CT ABDOMEN AND PELVIS  Findings:  Hepatomegaly, 20 cm.  Suspect mild hepatic steatosis. No focal liver lesion.  Normal spleen, stomach, pancreas, gallbladder, biliary tract, adrenal glands, kidneys.  Improvement to resolution of retroperitoneal adenopathy. - A retroperitoneal nodes just inferior to the left renal vein in the left para-aortic station measures 8 mm today versus 2.3 x 2.0 cm on the prior. - Immediately inferior left para-aortic node measures 1.0 cm on image 80 versus 1.1 cm on the prior.  The adjacent mesenteric node is resolved.  No retrocrural adenopathy.  Normal colon, appendix, and terminal ileum.  Normal small bowel without abdominal ascites.  Inguinal nodes are stable and not pathologic by size criteria. No pelvic adenopathy.    Normal urinary bladder and prostate.  No significant free fluid.  Left-sided testicular prosthesis is incompletely imaged.  Similar probable bone island in the right femoral neck.  Similar sclerotic focus within the right iliac bone.  IMPRESSION:  1.  Response to therapy of retroperitoneal and posterior mesenteric adenopathy.  The largest left para-aortic node is now borderline pathologic by size criteria. 2.  No new or progressive disease. 3.  Hepatomegaly and suspicion of mild hepatic steatosis.  Original Report Authenticated By: Consuello Bossier, M.D.   Ct Abdomen Pelvis W Contrast  07/30/2011  *RADIOLOGY REPORT*  Clinical Data:  Status post chemotherapy for testicular cancer. Chronic left-sided pain.  Surgery 05/21/2010.  Began chemotherapy in April of this year.  CT CHEST, ABDOMEN AND PELVIS WITH CONTRAST   Technique: Contiguous axial images of the chest abdomen and pelvis were obtained after IV contrast administration.  Contrast: 100  ml Omnipaque-300  Comparison: 05/21/2011  CT CHEST  Findings: Lung windows demonstrate minimal subpleural nodularity in the  right upper lobe on image 18, unchanged.  Minimal scarring or atelectasis at the right lung base, unchanged.  Soft tissue windows demonstrate no supraclavicular adenopathy.  A right-sided Port-A-Cath terminates at the superior caval/atrial junction.  There is right greater than left bilateral gynecomastia. Normal heart size without pericardial or pleural effusion.  Filling defect within right lower lobe pulmonary arterial tree.  Example images 28 - 30 of series 2. Confirmed on sagittal image 46 and coronal image 55.  New.  No mediastinal or hilar adenopathy.  IMPRESSION:  1.  No  evidence of metastatic disease in the chest. 2.  Right-sided pulmonary emboli on this non dedicated study. These results were called by telephone on 07/30/2011  at  12:53 p.m. to Amy, r.n., who verbally acknowledged these results. 3.  Right greater than left gynecomastia, as before.  CT ABDOMEN AND PELVIS  Findings:  Hepatomegaly, 20 cm.  Suspect mild hepatic steatosis. No focal liver lesion.  Normal spleen, stomach, pancreas, gallbladder, biliary tract, adrenal glands, kidneys.  Improvement to resolution of retroperitoneal adenopathy. - A retroperitoneal nodes just inferior to the left renal vein in the left para-aortic station measures 8 mm today versus 2.3 x 2.0 cm on the prior. - Immediately inferior left para-aortic node measures 1.0 cm on image 80 versus 1.1 cm on the prior.  The adjacent mesenteric node is resolved.  No retrocrural adenopathy.  Normal colon, appendix, and terminal ileum.  Normal small bowel without abdominal ascites.  Inguinal nodes are stable and not pathologic by size criteria. No pelvic adenopathy.    Normal urinary bladder and prostate.  No significant free  fluid.  Left-sided testicular prosthesis is incompletely imaged.  Similar probable bone island in the right femoral neck.  Similar sclerotic focus within the right iliac bone.  IMPRESSION:  1.  Response to therapy of retroperitoneal and posterior mesenteric adenopathy.  The largest left para-aortic node is now borderline pathologic by size criteria. 2.  No new or progressive disease. 3.  Hepatomegaly and suspicion of mild hepatic steatosis.  Original Report Authenticated By: Consuello Bossier, M.D.    Review of Systems  Constitutional: Negative.   HENT: Negative.   Eyes: Negative.   Respiratory: Negative.   Cardiovascular: Negative.   Gastrointestinal: Negative.   Genitourinary: Negative.   Musculoskeletal: Negative.   Skin: Negative.   Neurological: Negative.   Endo/Heme/Allergies: Negative.   Psychiatric/Behavioral: Negative.     Blood pressure 121/80, pulse 69, temperature 98.1 F (36.7 C), temperature source Oral, resp. rate 16, SpO2 100.00%. Physical Exam  Constitutional: He is oriented to person, place, and time. He appears well-developed and well-nourished. No distress.  HENT:  Head: Normocephalic and atraumatic.  Right Ear: External ear normal.  Left Ear: External ear normal.  Nose: Nose normal.  Mouth/Throat: Oropharynx is clear and moist. No oropharyngeal exudate.  Eyes: Conjunctivae are normal. Pupils are equal, round, and reactive to light. Right eye exhibits no discharge.  Neck: Normal range of motion. Neck supple.  Cardiovascular: Normal rate and regular rhythm.   Respiratory: Effort normal and breath sounds normal. No respiratory distress. He has no wheezes. He has no rales.  GI: Soft. Bowel sounds are normal.  Musculoskeletal: Normal range of motion. He exhibits no edema and no tenderness.  Neurological: He is alert and oriented to person, place, and time.       Moves all limbs.  Skin: Skin is warm and dry. No rash noted. He is not diaphoretic. No erythema.    Psychiatric: His  behavior is normal.     Assessment/Plan #1. Pulmonary embolism - have placed patient on Lovenox and Coumadin. Patient at this time is hemodynamically stable. May discuss with patient's oncologist in a.m. about starting Xarelto. #2. Testicular seminoma - per oncologist.  CODE STATUS - full code.  Acire Tang N. 07/31/2011, 12:21 AM

## 2011-07-31 NOTE — Progress Notes (Signed)
Subjective: Patient seen and examined this am. Denies any symptoms  Objective:  Vital signs in last 24 hours:  Filed Vitals:   07/30/11 2300 07/30/11 2351 07/31/11 0102 07/31/11 0455  BP: 121/80 121/80 128/83 116/70  Pulse: 81 69 76 79  Temp:  98.1 F (36.7 C) 98.6 F (37 C) 97.7 F (36.5 C)  TempSrc:  Oral Oral Oral  Resp:  16 16 16   Height:   6' (1.829 m)   Weight:   101.56 kg (223 lb 14.4 oz)   SpO2: 100% 100% 95% 98%    Intake/Output from previous day:   Intake/Output Summary (Last 24 hours) at 07/31/11 1250 Last data filed at 07/31/11 0543  Gross per 24 hour  Intake 368.75 ml  Output      0 ml  Net 368.75 ml    Physical Exam:  General: , in no acute distress. HEENT: no pallor, no icterus, moist oral mucosa, no JVD, no lymphadenopathy Heart: Normal  s1 &s2  Regular rate and rhythm, without murmurs, rubs, gallops. Lungs: rt sided port a cath, Clear to auscultation bilaterally. Abdomen: Soft, nontender, nondistended, positive bowel sounds. Extremities: No clubbing cyanosis or edema with positive pedal pulses. Neuro: Alert, awake, oriented x3, nonfocal.   Lab Results:  Basic Metabolic Panel:    Component Value Date/Time   NA 140 07/31/2011 0300   NA 136 07/13/2011 1112   K 4.1 07/31/2011 0300   K 4.4 07/13/2011 1112   CL 106 07/31/2011 0300   CL 98 07/13/2011 1112   CO2 26 07/31/2011 0300   CO2 28 07/13/2011 1112   BUN 24* 07/31/2011 0300   BUN 18 07/13/2011 1112   CREATININE 0.98 07/31/2011 0300   CREATININE 1.2 07/13/2011 1112   GLUCOSE 125* 07/31/2011 0300   GLUCOSE 143* 07/13/2011 1112   CALCIUM 8.9 07/31/2011 0300   CALCIUM 8.5 07/13/2011 1112   CBC:    Component Value Date/Time   WBC 3.2* 07/31/2011 0300   WBC 3.5* 07/13/2011 0836   HGB 12.8* 07/31/2011 0300   HGB 14.5 07/13/2011 0836   HCT 37.7* 07/31/2011 0300   HCT 41.7 07/13/2011 0836   PLT 118* 07/31/2011 0300   PLT 205 07/13/2011 0836   MCV 89.8 07/31/2011 0300   MCV 91 07/13/2011 0836   NEUTROABS  0.5* 07/13/2011 0836   LYMPHSABS 2.4 07/13/2011 0836   EOSABS 0.0 07/13/2011 0836   BASOSABS 0.0 07/13/2011 0836    No results found for this or any previous visit (from the past 240 hour(s)).  Studies/Results: Ct Chest W Contrast  07/30/2011  *RADIOLOGY REPORT*  Clinical Data:  Status post chemotherapy for testicular cancer. Chronic left-sided pain.  Surgery 05/21/2010.  Began chemotherapy in April of this year.  CT CHEST, ABDOMEN AND PELVIS WITH CONTRAST  Technique: Contiguous axial images of the chest abdomen and pelvis were obtained after IV contrast administration.  Contrast: 100  ml Omnipaque-300  Comparison: 05/21/2011  CT CHEST  Findings: Lung windows demonstrate minimal subpleural nodularity in the right upper lobe on image 18, unchanged.  Minimal scarring or atelectasis at the right lung base, unchanged.  Soft tissue windows demonstrate no supraclavicular adenopathy.  A right-sided Port-A-Cath terminates at the superior caval/atrial junction.  There is right greater than left bilateral gynecomastia. Normal heart size without pericardial or pleural effusion.  Filling defect within right lower lobe pulmonary arterial tree.  Example images 28 - 30 of series 2. Confirmed on sagittal image 46 and coronal image 55.  New.  No  mediastinal or hilar adenopathy.  IMPRESSION:  1.  No  evidence of metastatic disease in the chest. 2.  Right-sided pulmonary emboli on this non dedicated study. These results were called by telephone on 07/30/2011  at  12:53 p.m. to Amy, r.n., who verbally acknowledged these results. 3.  Right greater than left gynecomastia, as before.  CT ABDOMEN AND PELVIS  Findings:  Hepatomegaly, 20 cm.  Suspect mild hepatic steatosis. No focal liver lesion.  Normal spleen, stomach, pancreas, gallbladder, biliary tract, adrenal glands, kidneys.  Improvement to resolution of retroperitoneal adenopathy. - A retroperitoneal nodes just inferior to the left renal vein in the left para-aortic station  measures 8 mm today versus 2.3 x 2.0 cm on the prior. - Immediately inferior left para-aortic node measures 1.0 cm on image 80 versus 1.1 cm on the prior.  The adjacent mesenteric node is resolved.  No retrocrural adenopathy.  Normal colon, appendix, and terminal ileum.  Normal small bowel without abdominal ascites.  Inguinal nodes are stable and not pathologic by size criteria. No pelvic adenopathy.    Normal urinary bladder and prostate.  No significant free fluid.  Left-sided testicular prosthesis is incompletely imaged.  Similar probable bone island in the right femoral neck.  Similar sclerotic focus within the right iliac bone.  IMPRESSION:  1.  Response to therapy of retroperitoneal and posterior mesenteric adenopathy.  The largest left para-aortic node is now borderline pathologic by size criteria. 2.  No new or progressive disease. 3.  Hepatomegaly and suspicion of mild hepatic steatosis.  Original Report Authenticated By: Consuello Bossier, M.D.   Ct Abdomen Pelvis W Contrast  07/30/2011  *RADIOLOGY REPORT*  Clinical Data:  Status post chemotherapy for testicular cancer. Chronic left-sided pain.  Surgery 05/21/2010.  Began chemotherapy in April of this year.  CT CHEST, ABDOMEN AND PELVIS WITH CONTRAST  Technique: Contiguous axial images of the chest abdomen and pelvis were obtained after IV contrast administration.  Contrast: 100  ml Omnipaque-300  Comparison: 05/21/2011  CT CHEST  Findings: Lung windows demonstrate minimal subpleural nodularity in the right upper lobe on image 18, unchanged.  Minimal scarring or atelectasis at the right lung base, unchanged.  Soft tissue windows demonstrate no supraclavicular adenopathy.  A right-sided Port-A-Cath terminates at the superior caval/atrial junction.  There is right greater than left bilateral gynecomastia. Normal heart size without pericardial or pleural effusion.  Filling defect within right lower lobe pulmonary arterial tree.  Example images 28 - 30 of  series 2. Confirmed on sagittal image 46 and coronal image 55.  New.  No mediastinal or hilar adenopathy.  IMPRESSION:  1.  No  evidence of metastatic disease in the chest. 2.  Right-sided pulmonary emboli on this non dedicated study. These results were called by telephone on 07/30/2011  at  12:53 p.m. to Amy, r.n., who verbally acknowledged these results. 3.  Right greater than left gynecomastia, as before.  CT ABDOMEN AND PELVIS  Findings:  Hepatomegaly, 20 cm.  Suspect mild hepatic steatosis. No focal liver lesion.  Normal spleen, stomach, pancreas, gallbladder, biliary tract, adrenal glands, kidneys.  Improvement to resolution of retroperitoneal adenopathy. - A retroperitoneal nodes just inferior to the left renal vein in the left para-aortic station measures 8 mm today versus 2.3 x 2.0 cm on the prior. - Immediately inferior left para-aortic node measures 1.0 cm on image 80 versus 1.1 cm on the prior.  The adjacent mesenteric node is resolved.  No retrocrural adenopathy.  Normal colon, appendix, and  terminal ileum.  Normal small bowel without abdominal ascites.  Inguinal nodes are stable and not pathologic by size criteria. No pelvic adenopathy.    Normal urinary bladder and prostate.  No significant free fluid.  Left-sided testicular prosthesis is incompletely imaged.  Similar probable bone island in the right femoral neck.  Similar sclerotic focus within the right iliac bone.  IMPRESSION:  1.  Response to therapy of retroperitoneal and posterior mesenteric adenopathy.  The largest left para-aortic node is now borderline pathologic by size criteria. 2.  No new or progressive disease. 3.  Hepatomegaly and suspicion of mild hepatic steatosis.  Original Report Authenticated By: Consuello Bossier, M.D.    Medications: Scheduled Meds:   . coumadin book   Does not apply Once  . enoxaparin (LOVENOX) injection  100 mg Subcutaneous Once  . enoxaparin (LOVENOX) injection  100 mg Subcutaneous Q12H  . sodium  chloride  3 mL Intravenous Q12H  . warfarin  10 mg Oral ONCE-1800  . warfarin   Does not apply Once  . Warfarin - Pharmacist Dosing Inpatient   Does not apply q1800  . DISCONTD: enoxaparin  100 mg Subcutaneous Once   Continuous Infusions:   . DISCONTD: sodium chloride 75 mL/hr at 07/31/11 0400   PRN Meds:.acetaminophen, acetaminophen, ondansetron (ZOFRAN) IV, ondansetron, sodium chloride  Assessment/Plan:    *rt sided Pulmonary embolism Plan to treat with 3-6 moths of anticoagulation  on sq lovenox 100 mg bid and started on po coumadin  patient does not have insurance so will have to be on coumadin and cannot d/c on lovenox. Spoke with his oncologist Dr Myna Hidalgo who will see if he can arrange for outpt  lovenox, otherwise will be discharged once therpautic on coumadin    Testicular seminoma Getting chemo as outpt.  Has scheduled chemo on Monday  Full code  Regular diet   LOS: 1 day   Maddox Hlavaty 07/31/2011, 12:50 PM

## 2011-07-31 NOTE — Progress Notes (Signed)
ANTICOAGULATION CONSULT NOTE - Initial Consult  Pharmacy Consult for Lovenox/Coumadin Indication: pulmonary embolus  No Known Allergies  Patient Measurements: Height: 6' (182.9 cm) Weight: 223 lb 14.4 oz (101.56 kg) IBW/kg (Calculated) : 77.6   Vital Signs: Temp: 98.6 F (37 C) (05/10 0102) Temp src: Oral (05/10 0102) BP: 128/83 mmHg (05/10 0102) Pulse Rate: 76  (05/10 0102)  Labs:  Basename 07/30/11 2334 07/30/11 2104  HGB 12.6* 13.8  HCT 37.0* 39.4  PLT -- 119*  APTT -- 31  LABPROT -- 11.6  INR -- 0.83  HEPARINUNFRC -- --  CREATININE 1.00 1.16  CKTOTAL -- --  CKMB -- --  TROPONINI -- --    Estimated Creatinine Clearance: 134.4 ml/min (by C-G formula based on Cr of 1).   Medical History: Past Medical History  Diagnosis Date  . Cancer     testicular seminoma  . 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 Testicular cancer     Medications:  Prescriptions prior to admission  Medication Sig Dispense Refill  . dexamethasone (DECADRON) 4 MG tablet Take 2 tabs twice daily starting the day after chemo (day 6) for 3 days with food. Repeat with each cycle of chemo.  60 tablet  0   Scheduled:    . enoxaparin (LOVENOX) injection  100 mg Subcutaneous Once  . sodium chloride  3 mL Intravenous Q12H  . DISCONTD: enoxaparin  100 mg Subcutaneous Once    Assessment: 30yo male underwent CT due to chemotherapy for testicular seminoma and found by outpt staff to have acute PE though pt denies SOB/CP, to begin Lovenox and Coumadin (though noted that there is a possibility of switching to Xarelto).  Goal of Therapy:  INR 2-3 Anti-Xa level 0.6-1.2 units/ml 4hrs after LMWH dose given Monitor platelets by anticoagulation protocol: Yes   Plan:  Rec'd Lovenox in ED; will continue with Lovenox 100mg  SQ Q12H.  Will give Coumadin 10mg  po x1 today and begin Coumadin education.  Monitor CBC, INR, and levels prn.  Colleen Can PharmD BCPS 07/31/2011,1:44 AM

## 2011-08-01 DIAGNOSIS — C6290 Malignant neoplasm of unspecified testis, unspecified whether descended or undescended: Secondary | ICD-10-CM

## 2011-08-01 DIAGNOSIS — I2699 Other pulmonary embolism without acute cor pulmonale: Secondary | ICD-10-CM

## 2011-08-01 LAB — PROTIME-INR
INR: 0.94 (ref 0.00–1.49)
Prothrombin Time: 12.8 seconds (ref 11.6–15.2)

## 2011-08-01 MED ORDER — WARFARIN SODIUM 2.5 MG PO TABS
12.5000 mg | ORAL_TABLET | Freq: Once | ORAL | Status: AC
  Administered 2011-08-01: 12.5 mg via ORAL
  Filled 2011-08-01: qty 1

## 2011-08-01 NOTE — Progress Notes (Signed)
Anticoag: on lovenox and coumadin bridge for acute PE; pt denies SOB/CP, to begin Lovenox and Coumadin  Goal of Therapy: INR 2-3 (today 0.94); Day 2/5 of VTE overlap; no CBC today.    Plan: Rec'd Lovenox in ED; will continue with Lovenox 100mg  SQ Q12H. Will give Coumadin 12.5 mg po x1 today and educated. Monitor CBC, INR, and levels prn.

## 2011-08-01 NOTE — Progress Notes (Addendum)
Subjective: Patient seen and examined this am. No overnight issues  Objective:  Vital signs in last 24 hours:  Filed Vitals:   07/31/11 0455 07/31/11 1358 07/31/11 2135 08/01/11 0504  BP: 116/70 122/75 134/79 102/58  Pulse: 79 79 83 65  Temp: 97.7 F (36.5 C) 97.4 F (36.3 C) 98.4 F (36.9 C) 98.2 F (36.8 C)  TempSrc: Oral Oral Oral Oral  Resp: 16 18  18   Height:      Weight:      SpO2: 98% 98% 99% 99%    Intake/Output from previous day:  No intake or output data in the 24 hours ending 08/01/11 0951  Physical Exam:  General: young male in no acute distress.  HEENT: no pallor, no icterus, moist oral mucosa, no JVD, no lymphadenopathy  Heart: Normal s1 &s2 Regular rate and rhythm, without murmurs, rubs, gallops.  Lungs: rt sided port a cath, Clear to auscultation bilaterally.  Abdomen: Soft, nontender, nondistended, positive bowel sounds.  Extremities: No clubbing cyanosis or edema with positive pedal pulses.  Neuro: Alert, awake, oriented x3, nonfocal.   Lab Results:  Basic Metabolic Panel:    Component Value Date/Time   NA 140 07/31/2011 0300   NA 136 07/13/2011 1112   K 4.1 07/31/2011 0300   K 4.4 07/13/2011 1112   CL 106 07/31/2011 0300   CL 98 07/13/2011 1112   CO2 26 07/31/2011 0300   CO2 28 07/13/2011 1112   BUN 24* 07/31/2011 0300   BUN 18 07/13/2011 1112   CREATININE 0.98 07/31/2011 0300   CREATININE 1.2 07/13/2011 1112   GLUCOSE 125* 07/31/2011 0300   GLUCOSE 143* 07/13/2011 1112   CALCIUM 8.9 07/31/2011 0300   CALCIUM 8.5 07/13/2011 1112   CBC:    Component Value Date/Time   WBC 3.2* 07/31/2011 0300   WBC 3.5* 07/13/2011 0836   HGB 12.8* 07/31/2011 0300   HGB 14.5 07/13/2011 0836   HCT 37.7* 07/31/2011 0300   HCT 41.7 07/13/2011 0836   PLT 118* 07/31/2011 0300   PLT 205 07/13/2011 0836   MCV 89.8 07/31/2011 0300   MCV 91 07/13/2011 0836   NEUTROABS 0.5* 07/13/2011 0836   LYMPHSABS 2.4 07/13/2011 0836   EOSABS 0.0 07/13/2011 0836   BASOSABS 0.0 07/13/2011 0836      No results found for this or any previous visit (from the past 240 hour(s)).  Studies/Results: Ct Chest W Contrast  07/30/2011  *RADIOLOGY REPORT*  Clinical Data:  Status post chemotherapy for testicular cancer. Chronic left-sided pain.  Surgery 05/21/2010.  Began chemotherapy in April of this year.  CT CHEST, ABDOMEN AND PELVIS WITH CONTRAST  Technique: Contiguous axial images of the chest abdomen and pelvis were obtained after IV contrast administration.  Contrast: 100  ml Omnipaque-300  Comparison: 05/21/2011  CT CHEST  Findings: Lung windows demonstrate minimal subpleural nodularity in the right upper lobe on image 18, unchanged.  Minimal scarring or atelectasis at the right lung base, unchanged.  Soft tissue windows demonstrate no supraclavicular adenopathy.  A right-sided Port-A-Cath terminates at the superior caval/atrial junction.  There is right greater than left bilateral gynecomastia. Normal heart size without pericardial or pleural effusion.  Filling defect within right lower lobe pulmonary arterial tree.  Example images 28 - 30 of series 2. Confirmed on sagittal image 46 and coronal image 55.  New.  No mediastinal or hilar adenopathy.  IMPRESSION:  1.  No  evidence of metastatic disease in the chest. 2.  Right-sided pulmonary emboli on this  non dedicated study. These results were called by telephone on 07/30/2011  at  12:53 p.m. to Amy, r.n., who verbally acknowledged these results. 3.  Right greater than left gynecomastia, as before.  CT ABDOMEN AND PELVIS  Findings:  Hepatomegaly, 20 cm.  Suspect mild hepatic steatosis. No focal liver lesion.  Normal spleen, stomach, pancreas, gallbladder, biliary tract, adrenal glands, kidneys.  Improvement to resolution of retroperitoneal adenopathy. - A retroperitoneal nodes just inferior to the left renal vein in the left para-aortic station measures 8 mm today versus 2.3 x 2.0 cm on the prior. - Immediately inferior left para-aortic node measures 1.0 cm  on image 80 versus 1.1 cm on the prior.  The adjacent mesenteric node is resolved.  No retrocrural adenopathy.  Normal colon, appendix, and terminal ileum.  Normal small bowel without abdominal ascites.  Inguinal nodes are stable and not pathologic by size criteria. No pelvic adenopathy.    Normal urinary bladder and prostate.  No significant free fluid.  Left-sided testicular prosthesis is incompletely imaged.  Similar probable bone island in the right femoral neck.  Similar sclerotic focus within the right iliac bone.  IMPRESSION:  1.  Response to therapy of retroperitoneal and posterior mesenteric adenopathy.  The largest left para-aortic node is now borderline pathologic by size criteria. 2.  No new or progressive disease. 3.  Hepatomegaly and suspicion of mild hepatic steatosis.  Original Report Authenticated By: Consuello Bossier, M.D.   Ct Abdomen Pelvis W Contrast  07/30/2011  *RADIOLOGY REPORT*  Clinical Data:  Status post chemotherapy for testicular cancer. Chronic left-sided pain.  Surgery 05/21/2010.  Began chemotherapy in April of this year.  CT CHEST, ABDOMEN AND PELVIS WITH CONTRAST  Technique: Contiguous axial images of the chest abdomen and pelvis were obtained after IV contrast administration.  Contrast: 100  ml Omnipaque-300  Comparison: 05/21/2011  CT CHEST  Findings: Lung windows demonstrate minimal subpleural nodularity in the right upper lobe on image 18, unchanged.  Minimal scarring or atelectasis at the right lung base, unchanged.  Soft tissue windows demonstrate no supraclavicular adenopathy.  A right-sided Port-A-Cath terminates at the superior caval/atrial junction.  There is right greater than left bilateral gynecomastia. Normal heart size without pericardial or pleural effusion.  Filling defect within right lower lobe pulmonary arterial tree.  Example images 28 - 30 of series 2. Confirmed on sagittal image 46 and coronal image 55.  New.  No mediastinal or hilar adenopathy.  IMPRESSION:   1.  No  evidence of metastatic disease in the chest. 2.  Right-sided pulmonary emboli on this non dedicated study. These results were called by telephone on 07/30/2011  at  12:53 p.m. to Amy, r.n., who verbally acknowledged these results. 3.  Right greater than left gynecomastia, as before.  CT ABDOMEN AND PELVIS  Findings:  Hepatomegaly, 20 cm.  Suspect mild hepatic steatosis. No focal liver lesion.  Normal spleen, stomach, pancreas, gallbladder, biliary tract, adrenal glands, kidneys.  Improvement to resolution of retroperitoneal adenopathy. - A retroperitoneal nodes just inferior to the left renal vein in the left para-aortic station measures 8 mm today versus 2.3 x 2.0 cm on the prior. - Immediately inferior left para-aortic node measures 1.0 cm on image 80 versus 1.1 cm on the prior.  The adjacent mesenteric node is resolved.  No retrocrural adenopathy.  Normal colon, appendix, and terminal ileum.  Normal small bowel without abdominal ascites.  Inguinal nodes are stable and not pathologic by size criteria. No pelvic adenopathy.  Normal urinary bladder and prostate.  No significant free fluid.  Left-sided testicular prosthesis is incompletely imaged.  Similar probable bone island in the right femoral neck.  Similar sclerotic focus within the right iliac bone.  IMPRESSION:  1.  Response to therapy of retroperitoneal and posterior mesenteric adenopathy.  The largest left para-aortic node is now borderline pathologic by size criteria. 2.  No new or progressive disease. 3.  Hepatomegaly and suspicion of mild hepatic steatosis.  Original Report Authenticated By: Consuello Bossier, M.D.    Medications: Scheduled Meds:   . coumadin book   Does not apply Once  . enoxaparin (LOVENOX) injection  100 mg Subcutaneous Q12H  . sodium chloride  3 mL Intravenous Q12H  . warfarin  10 mg Oral ONCE-1800  . warfarin   Does not apply Once  . Warfarin - Pharmacist Dosing Inpatient   Does not apply q1800   Continuous  Infusions:   . DISCONTD: sodium chloride 75 mL/hr at 07/31/11 0400   PRN Meds:.acetaminophen, acetaminophen, ondansetron (ZOFRAN) IV, ondansetron, sodium chloride   ASSESSMENT/ PLAN:  *rt sided Pulmonary embolism  Plan to treat with 3-6 moths of anticoagulation  on sq lovenox 100 mg bid and started on po coumadin  patient does not have insurance so will have to be on coumadin and cannot d/c on lovenox. Spoke with his oncologist Dr Myna Hidalgo  Coumadin dosing per pharmacy  Testicular seminoma  Getting chemo as outpt.  Has scheduled chemo on Monday   Full code   Regular diet      LOS: 2 days   Alysson Geist 08/01/2011, 9:51 AM

## 2011-08-02 DIAGNOSIS — I2699 Other pulmonary embolism without acute cor pulmonale: Secondary | ICD-10-CM

## 2011-08-02 DIAGNOSIS — C6290 Malignant neoplasm of unspecified testis, unspecified whether descended or undescended: Secondary | ICD-10-CM

## 2011-08-02 LAB — PROTIME-INR
INR: 1.08 (ref 0.00–1.49)
Prothrombin Time: 14.2 seconds (ref 11.6–15.2)

## 2011-08-02 MED ORDER — WARFARIN SODIUM 7.5 MG PO TABS
15.0000 mg | ORAL_TABLET | Freq: Once | ORAL | Status: AC
  Administered 2011-08-02: 15 mg via ORAL
  Filled 2011-08-02: qty 2

## 2011-08-02 NOTE — Progress Notes (Signed)
CC: Acute PE  Anticoag: on lovenox and coumadin bridge for acute PE; pt denies SOB/CP, to begin Lovenox and Coumadin. Goal of Therapy: INR 2-3 (today up a little to 1.08); Day 3/5 of VTE overlap; no CBC today.   Plan: Rec'd Lovenox in ED; will continue with Lovenox 100mg  SQ Q12H. Will give Coumadin 15 mg po x1 today and educated. Monitor CBC, INR, and levels prn.

## 2011-08-02 NOTE — ED Provider Notes (Signed)
Medical screening examination/treatment/procedure(s) were performed by non-physician practitioner and as supervising physician I was immediately available for consultation/collaboration.   Benny Lennert, MD 08/02/11 [phone]

## 2011-08-02 NOTE — Progress Notes (Signed)
Subjective: Patient seen and examined this morning. No complaints  Objective:  Vital signs in last 24 hours:  Filed Vitals:   08/01/11 1410 08/01/11 2057 08/02/11 0546 08/02/11 1330  BP: 109/62 137/84 140/84 140/92  Pulse: 70 85 73 82  Temp: 98.8 F (37.1 C) 98.7 F (37.1 C) 98.3 F (36.8 C)   TempSrc:  Oral Oral   Resp: 18 18 18 16   Height:      Weight:      SpO2: 97% 98% 100% 100%    Intake/Output from previous day:   Intake/Output Summary (Last 24 hours) at 08/02/11 1551 Last data filed at 08/01/11 1750  Gross per 24 hour  Intake    240 ml  Output      0 ml  Net    240 ml    Physical Exam:   General: young male in no acute distress.  HEENT: no pallor, no icterus, moist oral mucosa, no JVD, no lymphadenopathy  Heart: Normal s1 &s2 Regular rate and rhythm, without murmurs, rubs, gallops.  Lungs: rt sided port a cath, Clear to auscultation bilaterally.  Abdomen: Soft, nontender, nondistended, positive bowel sounds.  Extremities: No clubbing cyanosis or edema with positive pedal pulses.  Neuro: Alert, awake, oriented x3, nonfocal.   Lab Results:  Basic Metabolic Panel:    Component Value Date/Time   NA 140 07/31/2011 0300   NA 136 07/13/2011 1112   K 4.1 07/31/2011 0300   K 4.4 07/13/2011 1112   CL 106 07/31/2011 0300   CL 98 07/13/2011 1112   CO2 26 07/31/2011 0300   CO2 28 07/13/2011 1112   BUN 24* 07/31/2011 0300   BUN 18 07/13/2011 1112   CREATININE 0.98 07/31/2011 0300   CREATININE 1.2 07/13/2011 1112   GLUCOSE 125* 07/31/2011 0300   GLUCOSE 143* 07/13/2011 1112   CALCIUM 8.9 07/31/2011 0300   CALCIUM 8.5 07/13/2011 1112   CBC:    Component Value Date/Time   WBC 3.2* 07/31/2011 0300   WBC 3.5* 07/13/2011 0836   HGB 12.8* 07/31/2011 0300   HGB 14.5 07/13/2011 0836   HCT 37.7* 07/31/2011 0300   HCT 41.7 07/13/2011 0836   PLT 118* 07/31/2011 0300   PLT 205 07/13/2011 0836   MCV 89.8 07/31/2011 0300   MCV 91 07/13/2011 0836   NEUTROABS 0.5* 07/13/2011 0836   LYMPHSABS 2.4 07/13/2011 0836   EOSABS 0.0 07/13/2011 0836   BASOSABS 0.0 07/13/2011 0836    No results found for this or any previous visit (from the past 240 hour(s)).  Studies/Results: No results found.  Medications: Scheduled Meds:   . enoxaparin (LOVENOX) injection  100 mg Subcutaneous Q12H  . sodium chloride  3 mL Intravenous Q12H  . warfarin  12.5 mg Oral ONCE-1800  . warfarin  15 mg Oral ONCE-1800  . Warfarin - Pharmacist Dosing Inpatient   Does not apply q1800   Continuous Infusions:  PRN Meds:.acetaminophen, acetaminophen, ondansetron (ZOFRAN) IV, ondansetron, sodium chloride  ASSESSMENT/ PLAN:  *rt sided Pulmonary embolism  Plan to treat with 3-6 moths of anticoagulation  on sq lovenox 100 mg bid and started on po coumadin  patient does not have insurance so will have to be on coumadin and cannot d/c on lovenox. Spoke with his oncologist Dr Myna Hidalgo  Coumadin dosing per pharmacy . Increased dose to 15 mg daily  Testicular seminoma  Getting chemo as outpt.  Has scheduled chemo on Monday   Full code   Regular diet  LOS: 3 days   Janei Scheff 08/02/2011, 3:51 PM

## 2011-08-03 ENCOUNTER — Ambulatory Visit: Payer: Self-pay | Admitting: Hematology & Oncology

## 2011-08-03 ENCOUNTER — Ambulatory Visit: Payer: Self-pay

## 2011-08-03 ENCOUNTER — Other Ambulatory Visit: Payer: Self-pay | Admitting: Lab

## 2011-08-03 DIAGNOSIS — C6290 Malignant neoplasm of unspecified testis, unspecified whether descended or undescended: Secondary | ICD-10-CM

## 2011-08-03 DIAGNOSIS — I2699 Other pulmonary embolism without acute cor pulmonale: Principal | ICD-10-CM

## 2011-08-03 LAB — CBC
MCH: 30.4 pg (ref 26.0–34.0)
MCHC: 34.5 g/dL (ref 30.0–36.0)
MCV: 88.2 fL (ref 78.0–100.0)
Platelets: 176 10*3/uL (ref 150–400)
RBC: 4.67 MIL/uL (ref 4.22–5.81)

## 2011-08-03 LAB — PROTIME-INR: Prothrombin Time: 18.3 seconds — ABNORMAL HIGH (ref 11.6–15.2)

## 2011-08-03 MED ORDER — HEPARIN SOD (PORK) LOCK FLUSH 100 UNIT/ML IV SOLN
500.0000 [IU] | INTRAVENOUS | Status: DC | PRN
  Administered 2011-08-03: 500 [IU]

## 2011-08-03 MED ORDER — HEPARIN SOD (PORK) LOCK FLUSH 100 UNIT/ML IV SOLN: 500.0000 [IU] | INTRAVENOUS | Status: DC

## 2011-08-03 MED ORDER — WARFARIN SODIUM 10 MG PO TABS: 10.0000 mg | ORAL_TABLET | Freq: Every day | ORAL | Status: DC

## 2011-08-03 MED ORDER — WARFARIN SODIUM 10 MG PO TABS
12.5000 mg | ORAL_TABLET | Freq: Once | ORAL | Status: AC
  Administered 2011-08-03: 12.5 mg via ORAL
  Filled 2011-08-03: qty 1

## 2011-08-03 MED ORDER — WARFARIN - PHARMACIST DOSING INPATIENT
Freq: Every day | Status: DC
  Administered 2011-08-03: 19:00:00

## 2011-08-03 NOTE — Progress Notes (Signed)
*  PRELIMINARY RESULTS* Vascular Ultrasound Lower Extremity Venous Duplex has been completed.  Preliminary findings: Right= Partially compressible proximal femoral vein suggests partial DVT. Left= No evidence of DVT.  Farrel Demark, RDMS 08/03/2011, 9:25 AM

## 2011-08-03 NOTE — Progress Notes (Signed)
UR Completed. Simmons, Marrah Vanevery F [phone]  

## 2011-08-03 NOTE — Discharge Summary (Addendum)
Patient ID: Devin Finley MRN: [id number] DOB/AGE: 20-Jul-1980 31 y.o.  Admit date: 07/30/2011 Discharge date: 08/03/2011  Primary Care Physician:   Follows with Dr Eduard Clos   Discharge Diagnoses:    Principal Problem:  *Pulmonary embolism Active Problems:  Testicular seminoma   Medication List  As of 08/03/2011  1:19 PM   TAKE these medications         dexamethasone 4 MG tablet   Commonly known as: DECADRON   Take 2 tabs twice daily starting the day after chemo (day 6) for 3 days with food.  Repeat with each cycle of chemo.      warfarin 10 MG tablet   Commonly known as: COUMADIN   Take 1 tablet (10 mg total) by mouth daily.            Disposition and Follow-up:  Follow up with Dr Myna Hidalgo tomorrow morning and will be prescribed few days of sq lovenox and monitor INR on coumadin as outpatient with him.   Consults:   Arlan Organ  Significant Diagnostic Studies:  No results found.  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 Patient otherwise denies any chest pain, shortness of breath, palpitations or dizziness.   Physical Exam on Discharge:  Filed Vitals:   08/02/11 0546 08/02/11 1330 08/02/11 1641 08/03/11 0637  BP: 140/84 140/92 121/72 111/77  Pulse: 73 82  69  Temp: 98.3 F (36.8 C)  98.7 F (37.1 C) 97.8 F (36.6 C)  TempSrc: Oral  Oral Oral  Resp: 18 16  18   Height:      Weight:      SpO2: 100% 100%  98%     Intake/Output Summary (Last 24 hours) at 08/03/11 1319 Last data filed at 08/03/11 0800  Gross per 24 hour  Intake    240 ml  Output      0 ml  Net    240 ml    General: Alert, awake, oriented x3, in no acute distress. HEENT: No bruits, no goiter. Heart: Regular  rate and rhythm, without murmurs, rubs, gallops. Lungs: Clear to auscultation bilaterally. Abdomen: Soft, nontender, nondistended, positive bowel sounds. Extremities: No clubbing cyanosis or edema with positive pedal pulses. Neuro: Grossly intact, nonfocal.  CBC:    Component Value Date/Time   WBC 3.8* 08/03/2011 0500   WBC 3.5* 07/13/2011 0836   HGB 14.2 08/03/2011 0500   HGB 14.5 07/13/2011 0836   HCT 41.2 08/03/2011 0500   HCT 41.7 07/13/2011 0836   PLT 176 08/03/2011 0500   PLT 205 07/13/2011 0836   MCV 88.2 08/03/2011 0500   MCV 91 07/13/2011 0836   NEUTROABS 0.5* 07/13/2011 0836   LYMPHSABS 2.4 07/13/2011 0836   EOSABS 0.0 07/13/2011 0836   BASOSABS 0.0 07/13/2011 0836    Basic Metabolic Panel:    Component Value Date/Time   NA 140 07/31/2011 0300   NA 136 07/13/2011 1112   K 4.1 07/31/2011 0300   K 4.4 07/13/2011 1112   CL 106 07/31/2011 0300   CL 98 07/13/2011 1112   CO2 26 07/31/2011 0300   CO2 28 07/13/2011 1112   BUN 24* 07/31/2011 0300   BUN 18 07/13/2011 1112   CREATININE 0.98 07/31/2011  0300   CREATININE 1.2 07/13/2011 1112   GLUCOSE 125* 07/31/2011 0300   GLUCOSE 143* 07/13/2011 1112   CALCIUM 8.9 07/31/2011 0300   CALCIUM 8.5 07/13/2011 1112    Hospital Course:  rt sided Pulmonary embolism  Patient completely asymptomatic Plan to treat with 3-6 moths of anticoagulation  on sq lovenox 100 mg bid and started on po coumadin  patient does not have insurance so will have to be on coumadin and could not d/c on lovenox. Spoke with his oncologist Dr Myna Hidalgo who evaluated patient today and informs that he will provide a few day supply of lovenox from his office to th patient while INR becomes therapeutic on coumadin. Patient can be discharge home after his evening dose of  lovenox today and follow up at Dr Gustavo Lah office tomorrow morning to get lovenox supplies. He will follow up at the office for periodic INR monitoring while on coumadin.  he will receive 12.5 mg coumadin this evening  and then on 10 mg po daily from tomorrow . His INR is improving and on discharge is 1.49. Doppler LE negative for DVT  Testicular seminoma  Getting chemo as outpt.  Has scheduled chemo today and has bee rescheduled for tomorrow   Patient to be discharged home after evening dose of lovenox.   Time spent on Discharge: 45 minutes  Signed: Eddie North 08/03/2011, 1:19 PM

## 2011-08-03 NOTE — Progress Notes (Signed)
ANTICOAGULATION CONSULT NOTE - Follow Up Consult  Pharmacy Consult for coumadin Indication: pulmonary embolus, possible RLE DVT  No Known Allergies  Patient Measurements: Height: 6' (182.9 cm) Weight: 223 lb 14.4 oz (101.56 kg) IBW/kg (Calculated) : 77.6    Vital Signs: Temp: 97.8 F (36.6 C) (05/13 0637) Temp src: Oral (05/13 0637) BP: 111/77 mmHg (05/13 0637) Pulse Rate: 69  (05/13 0637)  Labs:  Basename 08/03/11 0500 08/02/11 0500 08/01/11 0630  HGB 14.2 -- --  HCT 41.2 -- --  PLT 176 -- --  APTT -- -- --  LABPROT 18.3* 14.2 12.8  INR 1.49 1.08 0.94  HEPARINUNFRC -- -- --  CREATININE -- -- --  CKTOTAL -- -- --  CKMB -- -- --  TROPONINI -- -- --    Estimated Creatinine Clearance: 137.2 ml/min (by C-G formula based on Cr of 0.98).   Medications:  Scheduled:    . enoxaparin (LOVENOX) injection  100 mg Subcutaneous Q12H  . sodium chloride  3 mL Intravenous Q12H  . warfarin  15 mg Oral ONCE-1800  . DISCONTD: Warfarin - Pharmacist Dosing Inpatient   Does not apply q1800    Assessment: 31 yo male with PE and possible RLE DVT on coumadin and lovenox day 4 of overlap. INR increased to 1.49 and noted for likely d/c today with plan to obtain lovenox samples as outpatient. Coumadin increased to 15mg  08/02/11 and INR increase is likely due to coumadin doses prior to this.   Goal of Therapy:  INR 2-3 Monitor platelets by anticoagulation protocol: Yes   Plan:  -Will give coumadin 12.5mg  today -Could consider 12.5mg  coumadin today then 10mg /day as outpatient  Thank you, Harland German, Pharm D 08/03/2011 9:59 AM

## 2011-08-03 NOTE — Progress Notes (Signed)
1850 - pt d/c home with instructions and f/u appointment with oncologist. Pt verbalized understanding of instructions, escorted self out.  Ninetta Lights RN

## 2011-08-03 NOTE — Consult Note (Signed)
I greatly appreciate the excellent help bythe Hospitalist!!!! He has totally asymptomatic PE.  No  hx of DVT/PE in the family. Risk factors are cancer and platinum-based chemo.  We will get Doppler's of his legs to r/o DVT in LEs.  We have samples of Lovenox in the office for him.  Please give him his 2 doses of Lovenox today, then d/c to home. I will see him in the office in the AM and give him the samples.  I will dictate a full note on him.  Phillipians 4:13  Devin E.

## 2011-08-03 NOTE — Consult Note (Signed)
Devin Finley, Devin Finley NO.:  [id number]  MEDICAL RECORD NO.:  [id number]  LOCATION:  2028                         FACILITY:  [facility]  PHYSICIAN:  Josph Macho, M.D.  DATE OF BIRTH:  1980/05/20  DATE OF CONSULTATION:  08/03/2011 DATE OF DISCHARGE:  07/30/2011                                CONSULTATION   REASON FOR CONSULTATION: 1. Pulmonary embolism. 2. Recurrent seminoma.  HISTORY OF PRESENT ILLNESS:  Devin Finley is a 31 year old African- American gentleman.  He is well known to me.  He initially presented with a stage I seminoma a year ago.  He underwent orchiectomy.  He had no adverse prognostic factors.  This was a left testicular seminoma.  He did undergo high-dose carboplatin in the adjuvant setting.  He got 1 dose and he tolerated this well.  Unfortunately, he was noted to have recurrence on a CT scan done earlier this year.  His tumor markers showed his alpha fetoprotein to be slightly elevated.  I felt that this was all consistent with recurrence.  We treated him with 2 cycles of chemotherapy with etoposide and cisplatin.  He had a routine CT scan for followup to assess for response. Shockingly enough, the CT scan showed that he had a pulmonary embolism. This is over I think in the right lung.  This was totally asymptomatic. He had some slight hepatomegaly.  He had a nice response to his chemotherapy to date.  He was admitted.  We tried to get him to the office for outpatient therapy.  Unfortunately, he forgot his cell phone the day which I have called him.  He was admitted.  The hospitalists are doing a great job taking care of him.  He has not had Doppler studies yet.  We will get these set up.  He has had no bleeding.  There has been no cough.  There is no chest wall pain.  There is no hemoptysis.  There is no leg swelling.  He works out quite vigorously.  I think he works out every day.  He does take supplements for body building.  He  does not do any androgens or synthetic testosterone for body building.  There is no family history of DVTs.  PAST MEDICAL HISTORY:  This is pretty much unremarkable.  ALLERGIES:  None.  ADMISSION MEDICATIONS: 1. Phenergan 25 mg p.o. q.6 h. p.r.n. 2. Ativan 1 mg sublingual q.6 h. p.r.n.  SOCIAL HISTORY:  Negative for tobacco use.  I think there is rare alcohol use.  FAMILY HISTORY:  Again unremarkable for thromboembolic events.  PHYSICAL EXAMINATION:  GENERAL:  This is a very muscular black gentleman in no obvious distress. VITAL SIGNS:  97.8, pulse 69, respiratory rate 18, blood pressure 111/77. HEAD AND NECK:  A normocephalic, atraumatic skull.  There are no ocular or oral lesions.  There are no palpable cervical, supraclavicular lymph nodes. LUNGS:  Clear bilaterally. CARDIAC:  Regular rate and rhythm with no murmurs, rubs, or bruits. ABDOMEN:  Soft, good bowel sounds.  There is no palpable abdominal mass. No fluid wave.  No palpable hepatosplenomegaly is noted.  Well-healed left inguinal orchiectomy scar. EXTREMITIES:  No clubbing, cyanosis, or edema.  No obvious venous cords noted in the legs.  He has negative Homan sign. NEUROLOGICAL:  No focal neurological deficits.  LABORATORY STUDIES:  White cell count 3.8, hemoglobin 14.2, hematocrit 41.2, platelet count 176.  His electrolytes show normal LFTs.  BUN and creatinine are 24 and 0.98.  IMPRESSION:  Devin Finley is a 31 year old gentleman with pulmonary embolism detected on CT scan.  He told he has been asymptomatic from this.  His risk factor clearly is his underlying malignancy.  In addition, getting platinum-based chemotherapy also would probably put him at a higher risk for thromboembolic event.  He is very active, however.  He does not take synthetic testosterone or androgens for body building.  He does take additional supplements that I think these would be related.  I have no faith in Coumadin in situations  like this.  As such, I think he is going to need low molecular weight heparin for 6 months.  As such, we will see about treating him as an outpatient.  He has no insurance.  I will see about getting him on a low molecular weight heparin.  We will infuse either Lovenox or Arixtra.  We have Lovenox __________ in the office.  His dose is 100 mg twice a day.  I think he will need twice a day dosing because of his size.  I would like to get Dopplers of his legs.  I will strongly make sure that there is nothing going on down his lower extremities that could have led to this pulmonary embolism.  Hopefully, he will be discharged today.  We will plan to see him in the office tomorrow to get him on his Lovenox.     Josph Macho, M.D.     PRE/MEDQ  D:  08/03/2011  T:  08/03/2011  Job:  [id number]

## 2011-08-04 ENCOUNTER — Ambulatory Visit: Payer: Self-pay

## 2011-08-04 NOTE — Progress Notes (Signed)
Mr. Devin Finley, Devin Finley was provided today with education on subcutaneous injection for Lovenox. Patient instructions sheet on medication was given to the patient. Patient was not charge because [facility] not used [facility].

## 2011-08-04 NOTE — Patient Instructions (Signed)
Enoxaparin, Home Use  Enoxaparin (Lovenox) injection is a medication used to prevent clots from developing in your veins. Medications such as enoxaparin are called blood thinners or anticoagulants. If blood clots are untreated they could travel to your lungs. This is called a pulmonary embolus. A blood clot in your lungs can be fatal.  Caregivers often use anticoagulants such as enoxaparin to prevent clots following surgery. It is also used along with aspirin when the heart is not getting enough blood.  Usually another anticoagulant called warfarin (Coumadin) is started in 2 to 3 days after the enoxaparin is started. The enoxaparin is usually continued until the other anticoagulants have begun to work. Your caregiver will judge this length of time by measuring the length of time that it takes your blood to clot.  RISKS AND COMPLICATIONS   If you have received recent epidural anesthesia, spinal anesthesia, or a spinal tap while receiving anticoagulants, you are at risk for developing a blood clot in or around the spine. This condition could result in long-term or permanent paralysis.   Because anticoagulants thin your blood, severe bleeding may occur from any tissue or organ. Symptoms of the blood being too thin may include:   Bleeding from the nose or gums that does not stop quickly.   Unusual bruising or bruising easily.   Swelling or pain at an injection site.   A cut that does not stop bleeding within 10 minutes.   Continual nausea for more than 1 day or vomiting blood.   Coughing up blood.   Blood in the urine which may appear as pink, red, or brown urine.   Blood in bowel movements which may appear as red, dark or black stools.   Sudden weakness or numbness of the face, arm, or leg, especially on one side of the body.   Sudden confusion.   Trouble speaking (aphasia) or understanding.   Sudden trouble seeing in one or both eyes.   Sudden trouble walking.   Dizziness.   Loss of balance or  coordination.   Severe pain, such as a headache, joint pain, or back pain.   Fever.   Bruising around the injection sites may be expected.   Platelet drops, known as "thrombocytopenia," can occur with enoxaparin use. A condition called "heparin-induced thrombocytopenia" has been seen. If you have had this condition, you should tell your caregiver. Your caregiver may direct you to have blood tests to monitor this condition.   Do not use if you have allergies to the medication, heparin, or pork products.   Other side effects may include mild local reactions or irritation at the site of injection, pain, bruising, and redness of skin.  HOME CARE INSTRUCTIONS  You will be instructed by your caregiver how to give enoxaparin injections.  1. Before giving your medication you should make sure the injection is a clear and colorless or pale yellow solution. If your medication becomes discolored or has particles in the bottle, do not use and notify your caregiver.  2. When using the 30 and 40 mg pre-filled syringes, do not expel the air bubble from the syringe before the injection. This makes sure you use all the medication in the syringe.  3. The injections will be given subcutaneously. This means it is given into the fat over the belly (abdomen). It is given deep beneath the skin but not into the muscle. The shots should be injected around the abdominal wall. Change the sites of injection each time. The whole length   of the needle should be introduced into a skin fold held between the thumb and forefinger; the skin fold should be held throughout the injection. Do not rub the injection site after completion of the injection. This increases bruising. Enoxaparin injection pre-filled syringes and graduated pre-filled syringes are available with a system that shields the needle after injection.  4. Inject by pushing the plunger to the bottom of the syringe.  5. Remove the syringe from the injection site keeping your finger on  the plunger rod. Be careful not to stick yourself or others.  6. After injection and the syringe is empty, set off the safety system by firmly pushing the plunger rod. The protective sleeve will automatically cover the needle and you can hear a click. The click means your needle is safely covered. Do not try replacing the needle shield.  7. Get rid of the syringe in the nearest sharps container.  8. Keep your medication safely stored at room temperatures.   Due to the complications of anticoagulants, it is very important that you take your anticoagulant as directed by your caregiver. Anticoagulants need to be taken exactly as instructed. Be sure you understand all your anticoagulant instructions.   Changes in medicines, supplements, diet, and illness can affect your anticoagulation therapy. Be sure to inform your caregivers of any of these changes.   While on anticoagulants, you will need to have blood tests done routinely as directed by your caregivers.   Be careful not to cut yourself when using sharp objects.   Avoid heavy or variable alcohol use. Consume alcohol only in very limited quantities. General alcohol intake guidelines are 1 drink for nonpregnant women and 2 drinks for men per day. (1 drink = 5 ounces of wine, 12 ounces of beer, or 1 ounces of liquor). A sudden increase in alcohol use can increase your risk of bleeding. Chronic alcohol use can cause warfarin to be less effective.   Limit physical activities or sports that could result in a fall or cause injury.   It is extremely important that you tell all of your caregivers and dentist that you are taking an anticoagulant, especially if you are injured or plan to have any type of procedure or operation.   Follow up with your laboratory test and caregiver appointments as directed. It is very important to keep your appointments. Not keeping appointments could result in a chronic or permanent injury, pain, or disability.  SEEK MEDICAL CARE  IF:   You develop any rashes.   You have any worsening of the condition for which you are receiving anticoagulation therapy.  SEEK IMMEDIATE MEDICAL CARE IF:   Bleeding from the nose or gums does not stop quickly.   You have unusual bruising or are bruising easily.   Swelling or pain occurs at an injection site.   A cut does not stop bleeding within 10 minutes.   You have continual nausea for more than 1 day or are vomiting blood.   You are coughing up blood.   You have blood in the urine.   You have dark or black stools.   You have sudden weakness or numbness of the face, arm, or leg, especially on one side of the body.   You have sudden confusion.   You have trouble speaking (aphasia) or understanding.   You have sudden trouble seeing in one or both eyes.   You have sudden trouble walking.   You have dizziness.   You have a loss of   balance or coordination.   You have severe pain, such as a headache, joint pain, or back pain.   You have a serious fall or head injury, even if you are not bleeding.   You have an oral temperature above 102 F (38.9 C), not controlled by medicine.  ANY OF THESE SYMPTOMS MAY REPRESENT A SERIOUS PROBLEM THAT IS AN EMERGENCY. Do not wait to see if the symptoms will go away. Get medical help right away. Call your local emergency services (911 in [place] DO NOT drive yourself to the hospital.  MAKE SURE YOU:   Understand these instructions.   Will watch your condition.   Will get help right away if you are not doing well or get worse.  Document Released: 01/09/2004 Document Revised: 02/26/2011 Document Reviewed: 03/09/2005  [employer] Patient Information 2012 [employer].

## 2011-08-05 ENCOUNTER — Telehealth: Payer: Self-pay | Admitting: Hematology & Oncology

## 2011-08-05 ENCOUNTER — Ambulatory Visit: Payer: Self-pay

## 2011-08-05 ENCOUNTER — Other Ambulatory Visit: Payer: Self-pay | Admitting: *Deleted

## 2011-08-05 DIAGNOSIS — I2699 Other pulmonary embolism without acute cor pulmonale: Secondary | ICD-10-CM

## 2011-08-05 DIAGNOSIS — C6290 Malignant neoplasm of unspecified testis, unspecified whether descended or undescended: Secondary | ICD-10-CM

## 2011-08-05 MED ORDER — FONDAPARINUX SODIUM 10 MG/0.8ML ~~LOC~~ SOLN: 10.0000 mg | SUBCUTANEOUS | Status: DC

## 2011-08-05 NOTE — Telephone Encounter (Signed)
Faxed financial assistance application to [facility].  Included application, tax documents, and prescription.

## 2011-08-05 NOTE — Telephone Encounter (Signed)
Pt to change over to Arixtra once he receives it from the [facility] per Dr Myna Hidalgo.

## 2011-08-06 ENCOUNTER — Ambulatory Visit: Payer: Self-pay

## 2011-08-07 ENCOUNTER — Ambulatory Visit: Payer: Self-pay

## 2011-08-10 ENCOUNTER — Other Ambulatory Visit (HOSPITAL_BASED_OUTPATIENT_CLINIC_OR_DEPARTMENT_OTHER): Payer: Self-pay | Admitting: Lab

## 2011-08-10 ENCOUNTER — Ambulatory Visit (HOSPITAL_BASED_OUTPATIENT_CLINIC_OR_DEPARTMENT_OTHER): Payer: Self-pay

## 2011-08-10 ENCOUNTER — Other Ambulatory Visit: Payer: Self-pay | Admitting: *Deleted

## 2011-08-10 VITALS — BP 138/84 | HR 88 | Temp 97.7°F | Wt 234.0 lb

## 2011-08-10 DIAGNOSIS — C6290 Malignant neoplasm of unspecified testis, unspecified whether descended or undescended: Secondary | ICD-10-CM

## 2011-08-10 DIAGNOSIS — I2699 Other pulmonary embolism without acute cor pulmonale: Secondary | ICD-10-CM

## 2011-08-10 DIAGNOSIS — Z5111 Encounter for antineoplastic chemotherapy: Secondary | ICD-10-CM

## 2011-08-10 LAB — CBC WITH DIFFERENTIAL (CANCER CENTER ONLY)
EOS%: 0.9 % (ref 0.0–7.0)
LYMPH#: 2.7 10*3/uL (ref 0.9–3.3)
MCH: 31.4 pg (ref 28.0–33.4)
MCHC: 35 g/dL (ref 32.0–35.9)
MONO%: 12 % (ref 0.0–13.0)
NEUT#: 2.2 10*3/uL (ref 1.5–6.5)
Platelets: 208 10*3/uL (ref 145–400)
RBC: 4.33 10*6/uL (ref 4.20–5.70)

## 2011-08-10 LAB — TECHNOLOGIST REVIEW CHCC SATELLITE

## 2011-08-10 MED ORDER — HEPARIN SOD (PORK) LOCK FLUSH 100 UNIT/ML IV SOLN
500.0000 [IU] | Freq: Once | INTRAVENOUS | Status: AC | PRN
  Administered 2011-08-10: 500 [IU]
  Filled 2011-08-10: qty 5

## 2011-08-10 MED ORDER — PALONOSETRON HCL INJECTION 0.25 MG/5ML
0.2500 mg | Freq: Once | INTRAVENOUS | Status: AC
  Administered 2011-08-10: 0.25 mg via INTRAVENOUS

## 2011-08-10 MED ORDER — DEXAMETHASONE SODIUM PHOSPHATE 4 MG/ML IJ SOLN
20.0000 mg | Freq: Once | INTRAMUSCULAR | Status: AC
  Administered 2011-08-10: 20 mg via INTRAVENOUS

## 2011-08-10 MED ORDER — SODIUM CHLORIDE 0.9 % IJ SOLN
10.0000 mL | INTRAMUSCULAR | Status: DC | PRN
  Administered 2011-08-10: 10 mL
  Filled 2011-08-10: qty 10

## 2011-08-10 MED ORDER — FONDAPARINUX SODIUM 10 MG/0.8ML ~~LOC~~ SOLN: 10.0000 mg | SUBCUTANEOUS | Status: DC

## 2011-08-10 MED ORDER — SODIUM CHLORIDE 0.9 % IV SOLN
100.0000 mg/m2 | Freq: Once | INTRAVENOUS | Status: AC
  Administered 2011-08-10: 230 mg via INTRAVENOUS
  Filled 2011-08-10: qty 11.5

## 2011-08-10 MED ORDER — SODIUM CHLORIDE 0.9 % IV SOLN
20.0000 mg/m2 | Freq: Once | INTRAVENOUS | Status: AC
  Administered 2011-08-10: 46 mg via INTRAVENOUS
  Filled 2011-08-10: qty 46

## 2011-08-10 MED ORDER — SODIUM CHLORIDE 0.9 % IV SOLN: Freq: Once | INTRAVENOUS | Status: DC

## 2011-08-10 MED ORDER — POTASSIUM CHLORIDE 2 MEQ/ML IV SOLN
Freq: Once | INTRAVENOUS | Status: AC
  Administered 2011-08-10: 09:00:00 via INTRAVENOUS
  Filled 2011-08-10: qty 10

## 2011-08-10 NOTE — Telephone Encounter (Signed)
Issued 10 day rx for Arixtra to cover until he receives it from the [place] [employer]. There was a mix-up on their end therefore they provided resources for him to be able to receive it from a local pharmacy. Rx sent to [facility] with the information provided to [facility] to be written on the rx. Pt updated on the status and understands once he receives the Arixtra he is only to take it once a day instead of twice a day.

## 2011-08-10 NOTE — Patient Instructions (Addendum)

## 2011-08-11 ENCOUNTER — Other Ambulatory Visit: Payer: Self-pay | Admitting: Lab

## 2011-08-11 ENCOUNTER — Ambulatory Visit (HOSPITAL_BASED_OUTPATIENT_CLINIC_OR_DEPARTMENT_OTHER): Payer: Self-pay

## 2011-08-11 VITALS — BP 133/72 | HR 77 | Temp 97.6°F

## 2011-08-11 DIAGNOSIS — Z5111 Encounter for antineoplastic chemotherapy: Secondary | ICD-10-CM

## 2011-08-11 DIAGNOSIS — C6290 Malignant neoplasm of unspecified testis, unspecified whether descended or undescended: Secondary | ICD-10-CM

## 2011-08-11 MED ORDER — SODIUM CHLORIDE 0.9 % IV SOLN
20.0000 mg/m2 | Freq: Once | INTRAVENOUS | Status: AC
  Administered 2011-08-11: 46 mg via INTRAVENOUS
  Filled 2011-08-11: qty 46

## 2011-08-11 MED ORDER — SODIUM CHLORIDE 0.9 % IV SOLN: Freq: Once | INTRAVENOUS | Status: DC

## 2011-08-11 MED ORDER — DEXAMETHASONE SODIUM PHOSPHATE 4 MG/ML IJ SOLN
20.0000 mg | Freq: Once | INTRAMUSCULAR | Status: AC
  Administered 2011-08-11: 20 mg via INTRAVENOUS

## 2011-08-11 MED ORDER — SODIUM CHLORIDE 0.9 % IJ SOLN
10.0000 mL | INTRAMUSCULAR | Status: DC | PRN
  Administered 2011-08-11: 10 mL
  Filled 2011-08-11: qty 10

## 2011-08-11 MED ORDER — ETOPOSIDE CHEMO INJECTION 20 MG/ML
100.0000 mg/m2 | Freq: Once | INTRAVENOUS | Status: AC
  Administered 2011-08-11: 230 mg via INTRAVENOUS
  Filled 2011-08-11: qty 11.5

## 2011-08-11 MED ORDER — HEPARIN SOD (PORK) LOCK FLUSH 100 UNIT/ML IV SOLN
500.0000 [IU] | Freq: Once | INTRAVENOUS | Status: AC | PRN
  Administered 2011-08-11: 500 [IU]
  Filled 2011-08-11: qty 5

## 2011-08-11 MED ORDER — POTASSIUM CHLORIDE 2 MEQ/ML IV SOLN
Freq: Once | INTRAVENOUS | Status: AC
  Administered 2011-08-11: 09:00:00 via INTRAVENOUS
  Filled 2011-08-11: qty 10

## 2011-08-11 NOTE — Patient Instructions (Addendum)
Etoposide, VP-16 injection What is this medicine? ETOPOSIDE, VP-16 (e toe POE side) is a chemotherapy drug. It is used to treat testicular cancer, lung cancer, and other cancers. This medicine may be used for other purposes; ask your health care provider or pharmacist if you have questions. What should I tell my health care provider before I take this medicine? They need to know if you have any of these conditions: -infection -kidney disease -low blood counts, like low white cell, platelet, or red cell counts -an unusual or allergic reaction to etoposide, other chemotherapeutic agents, other medicines, foods, dyes, or preservatives -pregnant or trying to get pregnant -breast-feeding How should I use this medicine? This medicine is for infusion into a vein. It is administered in a hospital or clinic by a specially trained health care professional. Talk to your pediatrician regarding the use of this medicine in children. Special care may be needed. Overdosage: If you think you have taken too much of this medicine contact a poison control center or emergency room at once. NOTE: This medicine is only for you. Do not share this medicine with others. What if I miss a dose? It is important not to miss your dose. Call your doctor or health care professional if you are unable to keep an appointment. What may interact with this medicine? -cyclosporine -medicines to increase blood counts like filgrastim, pegfilgrastim, sargramostim -vaccines This list may not describe all possible interactions. Give your health care provider a list of all the medicines, herbs, non-prescription drugs, or dietary supplements you use. Also tell them if you smoke, drink alcohol, or use illegal drugs. Some items may interact with your medicine. What should I watch for while using this medicine? Visit your doctor for checks on your progress. This drug may make you feel generally unwell. This is not uncommon, as chemotherapy  can affect healthy cells as well as cancer cells. Report any side effects. Continue your course of treatment even though you feel ill unless your doctor tells you to stop. In some cases, you may be given additional medicines to help with side effects. Follow all directions for their use. Call your doctor or health care professional for advice if you get a fever, chills or sore throat, or other symptoms of a cold or flu. Do not treat yourself. This drug decreases your body's ability to fight infections. Try to avoid being around people who are sick. This medicine may increase your risk to bruise or bleed. Call your doctor or health care professional if you notice any unusual bleeding. Be careful brushing and flossing your teeth or using a toothpick because you may get an infection or bleed more easily. If you have any dental work done, tell your dentist you are receiving this medicine. Avoid taking products that contain aspirin, acetaminophen, ibuprofen, naproxen, or ketoprofen unless instructed by your doctor. These medicines may hide a fever. Do not become pregnant while taking this medicine. Women should inform their doctor if they wish to become pregnant or think they might be pregnant. There is a potential for serious side effects to an unborn child. Talk to your health care professional or pharmacist for more information. Do not breast-feed an infant while taking this medicine. What side effects may I notice from receiving this medicine? Side effects that you should report to your doctor or health care professional as soon as possible: -allergic reactions like skin rash, itching or hives, swelling of the face, lips, or tongue -low blood counts - this medicine   may decrease the number of white blood cells, red blood cells and platelets. You may be at increased risk for infections and bleeding. -signs of infection - fever or chills, cough, sore throat, pain or difficulty passing urine -signs of  decreased platelets or bleeding - bruising, pinpoint red spots on the skin, black, tarry stools, blood in the urine -signs of decreased red blood cells - unusually weak or tired, fainting spells, lightheadedness -breathing problems -changes in vision -mouth or throat sores or ulcers -pain, redness, swelling or irritation at the injection site -pain, tingling, numbness in the hands or feet -redness, blistering, peeling or loosening of the skin, including inside the mouth -seizures -vomiting Side effects that usually do not require medical attention (report to your doctor or health care professional if they continue or are bothersome): -diarrhea -hair loss -loss of appetite -nausea -stomach pain This list may not describe all possible side effects. Call your doctor for medical advice about side effects. You may report side effects to FDA at 1-800-FDA-1088. Where should I keep my medicine? This drug is given in a hospital or clinic and will not be stored at home. NOTE: This sheet is a summary. It may not cover all possible information. If you have questions about this medicine, talk to your doctor, pharmacist, or health care provider.  2012, [employer]. (07/11/2007 5:24:12 PM)Cisplatin injection What is this medicine? CISPLATIN (SIS pla tin) is a chemotherapy drug. It targets fast dividing cells, like cancer cells, and causes these cells to die. This medicine is used to treat many types of cancer like bladder, ovarian, and testicular cancers. This medicine may be used for other purposes; ask your health care provider or pharmacist if you have questions. What should I tell my health care provider before I take this medicine? They need to know if you have any of these conditions: -blood disorders -hearing problems -kidney disease -recent or ongoing radiation therapy -an unusual or allergic reaction to cisplatin, carboplatin, other chemotherapy, other medicines, foods, dyes, or  preservatives -pregnant or trying to get pregnant -breast-feeding How should I use this medicine? This drug is given as an infusion into a vein. It is administered in a hospital or clinic by a specially trained health care professional. Talk to your pediatrician regarding the use of this medicine in children. Special care may be needed. Overdosage: If you think you have taken too much of this medicine contact a poison control center or emergency room at once. NOTE: This medicine is only for you. Do not share this medicine with others. What if I miss a dose? It is important not to miss a dose. Call your doctor or health care professional if you are unable to keep an appointment. What may interact with this medicine? -dofetilide -foscarnet -medicines for seizures -medicines to increase blood counts like filgrastim, pegfilgrastim, sargramostim -probenecid -pyridoxine used with altretamine -rituximab -some antibiotics like amikacin, gentamicin, neomycin, polymyxin B, streptomycin, tobramycin -sulfinpyrazone -vaccines -zalcitabine Talk to your doctor or health care professional before taking any of these medicines: -acetaminophen -aspirin -ibuprofen -ketoprofen -naproxen This list may not describe all possible interactions. Give your health care provider a list of all the medicines, herbs, non-prescription drugs, or dietary supplements you use. Also tell them if you smoke, drink alcohol, or use illegal drugs. Some items may interact with your medicine. What should I watch for while using this medicine? Your condition will be monitored carefully while you are receiving this medicine. You will need important blood work done while   you are taking this medicine. This drug may make you feel generally unwell. This is not uncommon, as chemotherapy can affect healthy cells as well as cancer cells. Report any side effects. Continue your course of treatment even though you feel ill unless your doctor  tells you to stop. In some cases, you may be given additional medicines to help with side effects. Follow all directions for their use. Call your doctor or health care professional for advice if you get a fever, chills or sore throat, or other symptoms of a cold or flu. Do not treat yourself. This drug decreases your body's ability to fight infections. Try to avoid being around people who are sick. This medicine may increase your risk to bruise or bleed. Call your doctor or health care professional if you notice any unusual bleeding. Be careful brushing and flossing your teeth or using a toothpick because you may get an infection or bleed more easily. If you have any dental work done, tell your dentist you are receiving this medicine. Avoid taking products that contain aspirin, acetaminophen, ibuprofen, naproxen, or ketoprofen unless instructed by your doctor. These medicines may hide a fever. Do not become pregnant while taking this medicine. Women should inform their doctor if they wish to become pregnant or think they might be pregnant. There is a potential for serious side effects to an unborn child. Talk to your health care professional or pharmacist for more information. Do not breast-feed an infant while taking this medicine. Drink fluids as directed while you are taking this medicine. This will help protect your kidneys. Call your doctor or health care professional if you get diarrhea. Do not treat yourself. What side effects may I notice from receiving this medicine? Side effects that you should report to your doctor or health care professional as soon as possible: -allergic reactions like skin rash, itching or hives, swelling of the face, lips, or tongue -signs of infection - fever or chills, cough, sore throat, pain or difficulty passing urine -signs of decreased platelets or bleeding - bruising, pinpoint red spots on the skin, black, tarry stools, nosebleeds -signs of decreased red blood  cells - unusually weak or tired, fainting spells, lightheadedness -breathing problems -changes in hearing -gout pain -low blood counts - This drug may decrease the number of white blood cells, red blood cells and platelets. You may be at increased risk for infections and bleeding. -nausea and vomiting -pain, swelling, redness or irritation at the injection site -pain, tingling, numbness in the hands or feet -problems with balance, movement -trouble passing urine or change in the amount of urine Side effects that usually do not require medical attention (report to your doctor or health care professional if they continue or are bothersome): -changes in vision -loss of appetite -metallic taste in the mouth or changes in taste This list may not describe all possible side effects. Call your doctor for medical advice about side effects. You may report side effects to FDA at 1-800-FDA-1088. Where should I keep my medicine? This drug is given in a hospital or clinic and will not be stored at home. NOTE: This sheet is a summary. It may not cover all possible information. If you have questions about this medicine, talk to your doctor, pharmacist, or health care provider.  2012, [employer]. (06/14/2007 2:40:54 PM)      Cisplatin injection What is this medicine? CISPLATIN (SIS pla tin) is a chemotherapy drug. It targets fast dividing cells, like cancer cells, and causes these   cells to die. This medicine is used to treat many types of cancer like bladder, ovarian, and testicular cancers. This medicine may be used for other purposes; ask your health care provider or pharmacist if you have questions. What should I tell my health care provider before I take this medicine? They need to know if you have any of these conditions: -blood disorders -hearing problems -kidney disease -recent or ongoing radiation therapy -an unusual or allergic reaction to cisplatin, carboplatin, other  chemotherapy, other medicines, foods, dyes, or preservatives -pregnant or trying to get pregnant -breast-feeding How should I use this medicine? This drug is given as an infusion into a vein. It is administered in a hospital or clinic by a specially trained health care professional. Talk to your pediatrician regarding the use of this medicine in children. Special care may be needed. Overdosage: If you think you have taken too much of this medicine contact a poison control center or emergency room at once. NOTE: This medicine is only for you. Do not share this medicine with others. What if I miss a dose? It is important not to miss a dose. Call your doctor or health care professional if you are unable to keep an appointment. What may interact with this medicine? -dofetilide -foscarnet -medicines for seizures -medicines to increase blood counts like filgrastim, pegfilgrastim, sargramostim -probenecid -pyridoxine used with altretamine -rituximab -some antibiotics like amikacin, gentamicin, neomycin, polymyxin B, streptomycin, tobramycin -sulfinpyrazone -vaccines -zalcitabine Talk to your doctor or health care professional before taking any of these medicines: -acetaminophen -aspirin -ibuprofen -ketoprofen -naproxen This list may not describe all possible interactions. Give your health care provider a list of all the medicines, herbs, non-prescription drugs, or dietary supplements you use. Also tell them if you smoke, drink alcohol, or use illegal drugs. Some items may interact with your medicine. What should I watch for while using this medicine? Your condition will be monitored carefully while you are receiving this medicine. You will need important blood work done while you are taking this medicine. This drug may make you feel generally unwell. This is not uncommon, as chemotherapy can affect healthy cells as well as cancer cells. Report any side effects. Continue your course of  treatment even though you feel ill unless your doctor tells you to stop. In some cases, you may be given additional medicines to help with side effects. Follow all directions for their use. Call your doctor or health care professional for advice if you get a fever, chills or sore throat, or other symptoms of a cold or flu. Do not treat yourself. This drug decreases your body's ability to fight infections. Try to avoid being around people who are sick. This medicine may increase your risk to bruise or bleed. Call your doctor or health care professional if you notice any unusual bleeding. Be careful brushing and flossing your teeth or using a toothpick because you may get an infection or bleed more easily. If you have any dental work done, tell your dentist you are receiving this medicine. Avoid taking products that contain aspirin, acetaminophen, ibuprofen, naproxen, or ketoprofen unless instructed by your doctor. These medicines may hide a fever. Do not become pregnant while taking this medicine. Women should inform their doctor if they wish to become pregnant or think they might be pregnant. There is a potential for serious side effects to an unborn child. Talk to your health care professional or pharmacist for more information. Do not breast-feed an infant while taking this medicine. Drink   fluids as directed while you are taking this medicine. This will help protect your kidneys. Call your doctor or health care professional if you get diarrhea. Do not treat yourself. What side effects may I notice from receiving this medicine? Side effects that you should report to your doctor or health care professional as soon as possible: -allergic reactions like skin rash, itching or hives, swelling of the face, lips, or tongue -signs of infection - fever or chills, cough, sore throat, pain or difficulty passing urine -signs of decreased platelets or bleeding - bruising, pinpoint red spots on the skin, black,  tarry stools, nosebleeds -signs of decreased red blood cells - unusually weak or tired, fainting spells, lightheadedness -breathing problems -changes in hearing -gout pain -low blood counts - This drug may decrease the number of white blood cells, red blood cells and platelets. You may be at increased risk for infections and bleeding. -nausea and vomiting -pain, swelling, redness or irritation at the injection site -pain, tingling, numbness in the hands or feet -problems with balance, movement -trouble passing urine or change in the amount of urine Side effects that usually do not require medical attention (report to your doctor or health care professional if they continue or are bothersome): -changes in vision -loss of appetite -metallic taste in the mouth or changes in taste This list may not describe all possible side effects. Call your doctor for medical advice about side effects. You may report side effects to FDA at 1-800-FDA-1088. Where should I keep my medicine? This drug is given in a hospital or clinic and will not be stored at home. NOTE: This sheet is a summary. It may not cover all possible information. If you have questions about this medicine, talk to your doctor, pharmacist, or health care provider.  2012, [employer]. (06/14/2007 2:40:54 PM)    Fondaparinux Injections Fondaparinux (Arixtra) injection is a blood thinner (anticoagulant) medication that "thins" the blood and helps to prevent blood clots from developing in your veins. If blood clots develop and are left untreated, they can travel to your heart, lungs, or brain. These clots can cause serious illness and can be fatal. Blood clots can form due to:  Prolonged immobility such as:   People who cannot get out of bed (bedridden).   Sitting for long periods of time, such as long airplane flights.   Surgery, especially operations that involve:   Orthopedic (bones and joints).   The abdomen.    Obesity.   Certain heart conditions.   Certain cancers.   Hormone Replacement Therapy (HRT) or birth control pills (uncommon but possible).  WHAT IF A BLOOD CLOT HAS ALREADY DEVELOPED? If a blood clot has developed, caregivers can use fondaparinux to treat clots in the legs or lungs. In addition to fondaparinux, another blood thinner called warfarin (Coumadin) will be started 2 to 3 days after fondaparinux has been started. warfarin is a pill and must be taken simultaneously with fondaparinux until the warfarin has begun to work.  Your caregiver may use a blood test called an INR or International Normalization Ratio to know when there is enough warfarin in the blood. Fondaparinux may be stopped when the INR level is between 2.0 to 3.0. This means that your blood is at the necessary and best level to treat the clots that have formed.  OTHER FONDAPARINUX USES In [place], fondaparinux is sometimes used to help when there is not enough blood flow to the heart. HOME CARE INSTRUCTIONS   Fondaparinux should not   be used if you have allergies to the medication, heparin or pork products.   Before giving your medication, make sure the solution is a clear and colorless. If your medication becomes discolored or has particles in the bottle, do not use it. Notify your caregiver right away.   Keep your medication safely stored at room temperatures.   You will be instructed by your caregiver how to give fondaparinux injections.  HOW TO INJECT FONDAPARINUX 1. Fondaparinux is a shot that is given in the stomach (abdomen). Change (rotate) the injection site each time you give yourself a shot.  2. Twist the plunger cap and remove it.  3. Hold the syringe with either hand and use your other hand to twist the rigid needle guard (covers the needle) counter-clockwise. Pull the rigid needle guard straight off the needle. Discard the needle guard.  4. When using the pre-filled syringes, do not expel the air bubble  from the syringe before the injection. The air bubble helps to inject all of the medication when it is given.  5. The injection will be given just underneath the skin into the fat of the belly (subcutaneously). The shots should be injected around the outside of the belly (abdominal wall). Change the place where you give the shot each time. The whole length of the needle should be introduced into a skin fold held between the thumb and forefinger; the skin fold should be held throughout the injection.  6. Inject fondaparinux by pushing the plunger to the bottom of the syringe. Push the plunger rod firmly with your thumb as far as it will go. This will ensure you have injected all of the medication. Do not rub the injection site after completion of the injection. This increases bruising.  7. Remove the syringe from the injection site, keeping your finger on the plunger rod. Be careful not to stick yourself or others.  8. Fondaparinux injection pre-filled syringes and graduated pre-filled syringes are available with a system that shields the needle after injection. After injection and the syringe is empty, set off the safety system by firmly pushing the plunger rod. The protective sleeve will automatically cover the needle and you can hear a click. The click means your needle is safely covered.  9. Get rid of the syringe in the nearest needle box (sharps container).  FONDAPARINUX WARNINGS Problems with fondaparinux are rare. However, side effects and complications can occur, such as:  Serious side effects can include:   If you are taking fondaparinux or other low-molecular-weight heparins and have an epidural or spinal anesthesia or a spinal tap, you are at risk of developing a blood clot in or around the spine. This condition could result in long-term or permanent paralysis.   A condition called heparin-induced thrombocytopenia (HIT) can occur with fondaparinux use. This is where your platelet count drops.  HIT is rare. However, your caregiver will perform lab work to check your blood levels. If you have had this condition before, you should tell your caregiver.   Because fondaparinux thins your blood, complications may include bleeding into the bowel or kidneys.   Mild side effects may include:   Bruising around the injection site.   Mild irritation at the site of injection, such as pain, itching, or redness of skin.  SEEK IMMEDIATE MEDICAL CARE IF:  You develop bleeding problems such as:   Vomiting blood or coughing up blood.   Blood in your urine.   Blood in your stool or your stool has   a dark, tarry, or coffee ground appearance.   You develop any rashes on your skin, such as:   Multiple red "dots."   Long red streaks.   You have large areas of bruising on your skin.   A sudden nosebleed that does not stop after 15 to 20 minutes.   You have any worsening of the condition that led you to fondaparinux treatment.   You develop chest pain or shortness of breath. If the chest pain or shortness of breath is severe, call your local emergency service immediately!  MAKE SURE YOU:   Understand these instructions.   Will watch your condition.   Will get help right away if you are not doing well or get worse.  Document Released: 06/05/2008 Document Revised: 02/26/2011 Document Reviewed: 06/05/2008 [employer] Patient Information 2012 [employer]. 

## 2011-08-12 ENCOUNTER — Ambulatory Visit (HOSPITAL_BASED_OUTPATIENT_CLINIC_OR_DEPARTMENT_OTHER): Payer: Self-pay

## 2011-08-12 ENCOUNTER — Other Ambulatory Visit: Payer: Self-pay | Admitting: Lab

## 2011-08-12 VITALS — BP 133/81 | HR 67 | Temp 97.5°F

## 2011-08-12 DIAGNOSIS — Z5111 Encounter for antineoplastic chemotherapy: Secondary | ICD-10-CM

## 2011-08-12 DIAGNOSIS — C6290 Malignant neoplasm of unspecified testis, unspecified whether descended or undescended: Secondary | ICD-10-CM

## 2011-08-12 MED ORDER — ETOPOSIDE CHEMO INJECTION 20 MG/ML
100.0000 mg/m2 | Freq: Once | INTRAVENOUS | Status: AC
  Administered 2011-08-12: 230 mg via INTRAVENOUS
  Filled 2011-08-12: qty 11.5

## 2011-08-12 MED ORDER — SODIUM CHLORIDE 0.9 % IV SOLN
20.0000 mg/m2 | Freq: Once | INTRAVENOUS | Status: AC
  Administered 2011-08-12: 46 mg via INTRAVENOUS
  Filled 2011-08-12: qty 46

## 2011-08-12 MED ORDER — POTASSIUM CHLORIDE 2 MEQ/ML IV SOLN
Freq: Once | INTRAVENOUS | Status: AC
  Administered 2011-08-12: 11:00:00 via INTRAVENOUS
  Filled 2011-08-12: qty 10

## 2011-08-12 MED ORDER — SODIUM CHLORIDE 0.9 % IV SOLN
Freq: Once | INTRAVENOUS | Status: AC
  Administered 2011-08-12: 09:00:00 via INTRAVENOUS

## 2011-08-12 MED ORDER — DEXAMETHASONE SODIUM PHOSPHATE 4 MG/ML IJ SOLN
20.0000 mg | Freq: Once | INTRAMUSCULAR | Status: AC
  Administered 2011-08-12: 20 mg via INTRAVENOUS

## 2011-08-12 MED ORDER — SODIUM CHLORIDE 0.9 % IJ SOLN
10.0000 mL | INTRAMUSCULAR | Status: DC | PRN
  Administered 2011-08-12: 10 mL
  Filled 2011-08-12: qty 10

## 2011-08-12 MED ORDER — HEPARIN SOD (PORK) LOCK FLUSH 100 UNIT/ML IV SOLN
500.0000 [IU] | Freq: Once | INTRAVENOUS | Status: AC | PRN
  Administered 2011-08-12: 500 [IU]
  Filled 2011-08-12: qty 5

## 2011-08-12 MED ORDER — PALONOSETRON HCL INJECTION 0.25 MG/5ML
0.2500 mg | Freq: Once | INTRAVENOUS | Status: AC
  Administered 2011-08-12: 0.25 mg via INTRAVENOUS

## 2011-08-12 NOTE — Patient Instructions (Signed)

## 2011-08-13 ENCOUNTER — Other Ambulatory Visit: Payer: Self-pay | Admitting: Lab

## 2011-08-13 ENCOUNTER — Ambulatory Visit (HOSPITAL_BASED_OUTPATIENT_CLINIC_OR_DEPARTMENT_OTHER): Payer: Self-pay

## 2011-08-13 VITALS — BP 138/93 | HR 61 | Temp 96.3°F

## 2011-08-13 DIAGNOSIS — C6290 Malignant neoplasm of unspecified testis, unspecified whether descended or undescended: Secondary | ICD-10-CM

## 2011-08-13 DIAGNOSIS — Z5111 Encounter for antineoplastic chemotherapy: Secondary | ICD-10-CM

## 2011-08-13 LAB — COMPREHENSIVE METABOLIC PANEL
ALT: 55 U/L — ABNORMAL HIGH (ref 0–53)
AST: 27 U/L (ref 0–37)
Albumin: 4.2 g/dL (ref 3.5–5.2)
Alkaline Phosphatase: 69 U/L (ref 39–117)
Chloride: 107 mEq/L (ref 96–112)
Potassium: 4.3 mEq/L (ref 3.5–5.3)
Sodium: 141 mEq/L (ref 135–145)
Total Protein: 6.5 g/dL (ref 6.0–8.3)

## 2011-08-13 LAB — AFP TUMOR MARKER: AFP-Tumor Marker: 4.8 ng/mL (ref 0.0–8.0)

## 2011-08-13 MED ORDER — SODIUM CHLORIDE 0.9 % IV SOLN
Freq: Once | INTRAVENOUS | Status: AC
  Administered 2011-08-13: 09:00:00 via INTRAVENOUS

## 2011-08-13 MED ORDER — SODIUM CHLORIDE 0.9 % IV SOLN
20.0000 mg/m2 | Freq: Once | INTRAVENOUS | Status: AC
  Administered 2011-08-13: 46 mg via INTRAVENOUS
  Filled 2011-08-13: qty 46

## 2011-08-13 MED ORDER — SODIUM CHLORIDE 0.9 % IV SOLN
100.0000 mg/m2 | Freq: Once | INTRAVENOUS | Status: AC
  Administered 2011-08-13: 230 mg via INTRAVENOUS
  Filled 2011-08-13: qty 11.5

## 2011-08-13 MED ORDER — HEPARIN SOD (PORK) LOCK FLUSH 100 UNIT/ML IV SOLN
500.0000 [IU] | Freq: Once | INTRAVENOUS | Status: AC | PRN
  Administered 2011-08-13: 500 [IU]
  Filled 2011-08-13: qty 5

## 2011-08-13 MED ORDER — DEXAMETHASONE SODIUM PHOSPHATE 4 MG/ML IJ SOLN
20.0000 mg | Freq: Once | INTRAMUSCULAR | Status: AC
  Administered 2011-08-13: 20 mg via INTRAVENOUS

## 2011-08-13 MED ORDER — SODIUM CHLORIDE 0.9 % IJ SOLN
10.0000 mL | INTRAMUSCULAR | Status: DC | PRN
  Administered 2011-08-13: 10 mL
  Filled 2011-08-13: qty 10

## 2011-08-13 MED ORDER — POTASSIUM CHLORIDE 2 MEQ/ML IV SOLN
Freq: Once | INTRAVENOUS | Status: AC
  Administered 2011-08-13: 09:00:00 via INTRAVENOUS
  Filled 2011-08-13: qty 10

## 2011-08-13 NOTE — Patient Instructions (Signed)

## 2011-08-14 ENCOUNTER — Other Ambulatory Visit: Payer: Self-pay | Admitting: Lab

## 2011-08-14 ENCOUNTER — Ambulatory Visit (HOSPITAL_BASED_OUTPATIENT_CLINIC_OR_DEPARTMENT_OTHER): Payer: Self-pay

## 2011-08-14 VITALS — BP 126/79 | HR 87 | Temp 97.1°F

## 2011-08-14 DIAGNOSIS — Z5111 Encounter for antineoplastic chemotherapy: Secondary | ICD-10-CM

## 2011-08-14 DIAGNOSIS — C6290 Malignant neoplasm of unspecified testis, unspecified whether descended or undescended: Secondary | ICD-10-CM

## 2011-08-14 MED ORDER — PALONOSETRON HCL INJECTION 0.25 MG/5ML
0.2500 mg | Freq: Once | INTRAVENOUS | Status: AC
  Administered 2011-08-14: 0.25 mg via INTRAVENOUS

## 2011-08-14 MED ORDER — SODIUM CHLORIDE 0.9 % IV SOLN
20.0000 mg/m2 | Freq: Once | INTRAVENOUS | Status: AC
  Administered 2011-08-14: 46 mg via INTRAVENOUS
  Filled 2011-08-14: qty 46

## 2011-08-14 MED ORDER — SODIUM CHLORIDE 0.9 % IV SOLN
100.0000 mg/m2 | Freq: Once | INTRAVENOUS | Status: AC
  Administered 2011-08-14: 230 mg via INTRAVENOUS
  Filled 2011-08-14: qty 11.5

## 2011-08-14 MED ORDER — SODIUM CHLORIDE 0.9 % IJ SOLN
10.0000 mL | INTRAMUSCULAR | Status: DC | PRN
  Administered 2011-08-14: 10 mL
  Filled 2011-08-14: qty 10

## 2011-08-14 MED ORDER — DEXAMETHASONE SODIUM PHOSPHATE 4 MG/ML IJ SOLN
20.0000 mg | Freq: Once | INTRAMUSCULAR | Status: AC
  Administered 2011-08-14: 20 mg via INTRAVENOUS

## 2011-08-14 MED ORDER — POTASSIUM CHLORIDE 2 MEQ/ML IV SOLN
Freq: Once | INTRAVENOUS | Status: AC
  Administered 2011-08-14: 10:00:00 via INTRAVENOUS
  Filled 2011-08-14: qty 10

## 2011-08-14 MED ORDER — HEPARIN SOD (PORK) LOCK FLUSH 100 UNIT/ML IV SOLN
500.0000 [IU] | Freq: Once | INTRAVENOUS | Status: AC | PRN
  Administered 2011-08-14: 500 [IU]
  Filled 2011-08-14: qty 5

## 2011-08-14 MED ORDER — SODIUM CHLORIDE 0.9 % IV SOLN
Freq: Once | INTRAVENOUS | Status: AC
  Administered 2011-08-14: 09:00:00 via INTRAVENOUS

## 2011-08-14 NOTE — Patient Instructions (Signed)

## 2011-08-18 ENCOUNTER — Ambulatory Visit: Payer: Self-pay | Admitting: Hematology & Oncology

## 2011-08-18 ENCOUNTER — Ambulatory Visit: Payer: Self-pay

## 2011-08-18 ENCOUNTER — Other Ambulatory Visit: Payer: Self-pay | Admitting: Lab

## 2011-08-24 ENCOUNTER — Ambulatory Visit: Payer: Self-pay

## 2011-08-24 ENCOUNTER — Ambulatory Visit: Payer: Self-pay | Admitting: Hematology & Oncology

## 2011-08-24 ENCOUNTER — Other Ambulatory Visit: Payer: Self-pay | Admitting: Lab

## 2011-08-25 ENCOUNTER — Ambulatory Visit: Payer: Self-pay

## 2011-08-26 ENCOUNTER — Ambulatory Visit: Payer: Self-pay

## 2011-08-27 ENCOUNTER — Ambulatory Visit: Payer: Self-pay

## 2011-08-28 ENCOUNTER — Ambulatory Visit: Payer: Self-pay

## 2011-08-31 ENCOUNTER — Ambulatory Visit (HOSPITAL_BASED_OUTPATIENT_CLINIC_OR_DEPARTMENT_OTHER): Payer: Self-pay

## 2011-08-31 ENCOUNTER — Other Ambulatory Visit (HOSPITAL_BASED_OUTPATIENT_CLINIC_OR_DEPARTMENT_OTHER): Payer: Self-pay | Admitting: Lab

## 2011-08-31 ENCOUNTER — Ambulatory Visit (HOSPITAL_BASED_OUTPATIENT_CLINIC_OR_DEPARTMENT_OTHER): Payer: Self-pay | Admitting: Hematology & Oncology

## 2011-08-31 ENCOUNTER — Other Ambulatory Visit: Payer: Self-pay | Admitting: *Deleted

## 2011-08-31 VITALS — BP 139/92 | HR 83 | Temp 97.2°F | Ht 72.0 in | Wt 237.0 lb

## 2011-08-31 DIAGNOSIS — I2699 Other pulmonary embolism without acute cor pulmonale: Secondary | ICD-10-CM

## 2011-08-31 DIAGNOSIS — Z5111 Encounter for antineoplastic chemotherapy: Secondary | ICD-10-CM

## 2011-08-31 DIAGNOSIS — C6290 Malignant neoplasm of unspecified testis, unspecified whether descended or undescended: Secondary | ICD-10-CM

## 2011-08-31 DIAGNOSIS — I82409 Acute embolism and thrombosis of unspecified deep veins of unspecified lower extremity: Secondary | ICD-10-CM

## 2011-08-31 DIAGNOSIS — K1379 Other lesions of oral mucosa: Secondary | ICD-10-CM

## 2011-08-31 LAB — CBC WITH DIFFERENTIAL (CANCER CENTER ONLY)
BASO#: 0 10*3/uL (ref 0.0–0.2)
BASO%: 0.3 % (ref 0.0–2.0)
EOS%: 1 % (ref 0.0–7.0)
Eosinophils Absolute: 0 10*3/uL (ref 0.0–0.5)
HCT: 36.6 % — ABNORMAL LOW (ref 38.7–49.9)
HGB: 12.8 g/dL — ABNORMAL LOW (ref 13.0–17.1)
LYMPH#: 2 10*3/uL (ref 0.9–3.3)
LYMPH%: 63.1 % — ABNORMAL HIGH (ref 14.0–48.0)
MCH: 31.9 pg (ref 28.0–33.4)
MCHC: 35 g/dL (ref 32.0–35.9)
MCV: 91 fL (ref 82–98)
MONO#: 0.6 10*3/uL (ref 0.1–0.9)
MONO%: 20.5 % — ABNORMAL HIGH (ref 0.0–13.0)
NEUT#: 0.5 10*3/uL — ABNORMAL LOW (ref 1.5–6.5)
NEUT%: 15.1 % — ABNORMAL LOW (ref 40.0–80.0)
Platelets: 177 10*3/uL (ref 145–400)
RBC: 4.01 10*6/uL — ABNORMAL LOW (ref 4.20–5.70)
RDW: 14.8 % (ref 11.1–15.7)
WBC: 3.1 10*3/uL — ABNORMAL LOW (ref 4.0–10.0)

## 2011-08-31 MED ORDER — SODIUM CHLORIDE 0.9 % IJ SOLN
10.0000 mL | INTRAMUSCULAR | Status: DC | PRN
  Administered 2011-08-31: 10 mL
  Filled 2011-08-31: qty 10

## 2011-08-31 MED ORDER — SODIUM CHLORIDE 0.9 % IV SOLN
20.0000 mg/m2 | Freq: Once | INTRAVENOUS | Status: AC
  Administered 2011-08-31: 46 mg via INTRAVENOUS
  Filled 2011-08-31: qty 46

## 2011-08-31 MED ORDER — PALONOSETRON HCL INJECTION 0.25 MG/5ML
0.2500 mg | Freq: Once | INTRAVENOUS | Status: AC
  Administered 2011-08-31: 0.25 mg via INTRAVENOUS

## 2011-08-31 MED ORDER — SODIUM CHLORIDE 0.9 % IV SOLN: Freq: Once | INTRAVENOUS | Status: DC

## 2011-08-31 MED ORDER — DEXAMETHASONE SODIUM PHOSPHATE 4 MG/ML IJ SOLN
20.0000 mg | Freq: Once | INTRAMUSCULAR | Status: AC
  Administered 2011-08-31: 20 mg via INTRAVENOUS

## 2011-08-31 MED ORDER — POTASSIUM CHLORIDE 2 MEQ/ML IV SOLN
Freq: Once | INTRAVENOUS | Status: AC
  Administered 2011-08-31: 09:00:00 via INTRAVENOUS
  Filled 2011-08-31: qty 10

## 2011-08-31 MED ORDER — SODIUM CHLORIDE 0.9 % IV SOLN
100.0000 mg/m2 | Freq: Once | INTRAVENOUS | Status: AC
  Administered 2011-08-31: 230 mg via INTRAVENOUS
  Filled 2011-08-31: qty 11.5

## 2011-08-31 MED ORDER — MAGIC MOUTHWASH W/LIDOCAINE: 5.0000 mL | Freq: Four times a day (QID) | ORAL | Status: DC | PRN

## 2011-08-31 MED ORDER — HEPARIN SOD (PORK) LOCK FLUSH 100 UNIT/ML IV SOLN
500.0000 [IU] | Freq: Once | INTRAVENOUS | Status: AC | PRN
  Administered 2011-08-31: 500 [IU]
  Filled 2011-08-31: qty 5

## 2011-08-31 NOTE — Progress Notes (Signed)
This office note has been dictated.

## 2011-08-31 NOTE — Patient Instructions (Signed)
Etoposide, VP-16 injection What is this medicine? ETOPOSIDE, VP-16 (e toe POE side) is a chemotherapy drug. It is used to treat testicular cancer, lung cancer, and other cancers. This medicine may be used for other purposes; ask your health care provider or pharmacist if you have questions. What should I tell my health care provider before I take this medicine? They need to know if you have any of these conditions: -infection -kidney disease -low blood counts, like low white cell, platelet, or red cell counts -an unusual or allergic reaction to etoposide, other chemotherapeutic agents, other medicines, foods, dyes, or preservatives -pregnant or trying to get pregnant -breast-feeding How should I use this medicine? This medicine is for infusion into a vein. It is administered in a hospital or clinic by a specially trained health care professional. Talk to your pediatrician regarding the use of this medicine in children. Special care may be needed. Overdosage: If you think you have taken too much of this medicine contact a poison control center or emergency room at once. NOTE: This medicine is only for you. Do not share this medicine with others. What if I miss a dose? It is important not to miss your dose. Call your doctor or health care professional if you are unable to keep an appointment. What may interact with this medicine? -cyclosporine -medicines to increase blood counts like filgrastim, pegfilgrastim, sargramostim -vaccines This list may not describe all possible interactions. Give your health care provider a list of all the medicines, herbs, non-prescription drugs, or dietary supplements you use. Also tell them if you smoke, drink alcohol, or use illegal drugs. Some items may interact with your medicine. What should I watch for while using this medicine? Visit your doctor for checks on your progress. This drug may make you feel generally unwell. This is not uncommon, as chemotherapy  can affect healthy cells as well as cancer cells. Report any side effects. Continue your course of treatment even though you feel ill unless your doctor tells you to stop. In some cases, you may be given additional medicines to help with side effects. Follow all directions for their use. Call your doctor or health care professional for advice if you get a fever, chills or sore throat, or other symptoms of a cold or flu. Do not treat yourself. This drug decreases your body's ability to fight infections. Try to avoid being around people who are sick. This medicine may increase your risk to bruise or bleed. Call your doctor or health care professional if you notice any unusual bleeding. Be careful brushing and flossing your teeth or using a toothpick because you may get an infection or bleed more easily. If you have any dental work done, tell your dentist you are receiving this medicine. Avoid taking products that contain aspirin, acetaminophen, ibuprofen, naproxen, or ketoprofen unless instructed by your doctor. These medicines may hide a fever. Do not become pregnant while taking this medicine. Women should inform their doctor if they wish to become pregnant or think they might be pregnant. There is a potential for serious side effects to an unborn child. Talk to your health care professional or pharmacist for more information. Do not breast-feed an infant while taking this medicine. What side effects may I notice from receiving this medicine? Side effects that you should report to your doctor or health care professional as soon as possible: -allergic reactions like skin rash, itching or hives, swelling of the face, lips, or tongue -low blood counts - this medicine  may decrease the number of white blood cells, red blood cells and platelets. You may be at increased risk for infections and bleeding. -signs of infection - fever or chills, cough, sore throat, pain or difficulty passing urine -signs of  decreased platelets or bleeding - bruising, pinpoint red spots on the skin, black, tarry stools, blood in the urine -signs of decreased red blood cells - unusually weak or tired, fainting spells, lightheadedness -breathing problems -changes in vision -mouth or throat sores or ulcers -pain, redness, swelling or irritation at the injection site -pain, tingling, numbness in the hands or feet -redness, blistering, peeling or loosening of the skin, including inside the mouth -seizures -vomiting Side effects that usually do not require medical attention (report to your doctor or health care professional if they continue or are bothersome): -diarrhea -hair loss -loss of appetite -nausea -stomach pain This list may not describe all possible side effects. Call your doctor for medical advice about side effects. You may report side effects to FDA at 1-800-FDA-1088. Where should I keep my medicine? This drug is given in a hospital or clinic and will not be stored at home. NOTE: This sheet is a summary. It may not cover all possible information. If you have questions about this medicine, talk to your doctor, pharmacist, or health care provider.  2012, [employer]. (07/11/2007 5:24:12 PM)Cisplatin injection What is this medicine? CISPLATIN (SIS pla tin) is a chemotherapy drug. It targets fast dividing cells, like cancer cells, and causes these cells to die. This medicine is used to treat many types of cancer like bladder, ovarian, and testicular cancers. This medicine may be used for other purposes; ask your health care provider or pharmacist if you have questions. What should I tell my health care provider before I take this medicine? They need to know if you have any of these conditions: -blood disorders -hearing problems -kidney disease -recent or ongoing radiation therapy -an unusual or allergic reaction to cisplatin, carboplatin, other chemotherapy, other medicines, foods, dyes, or  preservatives -pregnant or trying to get pregnant -breast-feeding How should I use this medicine? This drug is given as an infusion into a vein. It is administered in a hospital or clinic by a specially trained health care professional. Talk to your pediatrician regarding the use of this medicine in children. Special care may be needed. Overdosage: If you think you have taken too much of this medicine contact a poison control center or emergency room at once. NOTE: This medicine is only for you. Do not share this medicine with others. What if I miss a dose? It is important not to miss a dose. Call your doctor or health care professional if you are unable to keep an appointment. What may interact with this medicine? -dofetilide -foscarnet -medicines for seizures -medicines to increase blood counts like filgrastim, pegfilgrastim, sargramostim -probenecid -pyridoxine used with altretamine -rituximab -some antibiotics like amikacin, gentamicin, neomycin, polymyxin B, streptomycin, tobramycin -sulfinpyrazone -vaccines -zalcitabine Talk to your doctor or health care professional before taking any of these medicines: -acetaminophen -aspirin -ibuprofen -ketoprofen -naproxen This list may not describe all possible interactions. Give your health care provider a list of all the medicines, herbs, non-prescription drugs, or dietary supplements you use. Also tell them if you smoke, drink alcohol, or use illegal drugs. Some items may interact with your medicine. What should I watch for while using this medicine? Your condition will be monitored carefully while you are receiving this medicine. You will need important blood work done while  you are taking this medicine. This drug may make you feel generally unwell. This is not uncommon, as chemotherapy can affect healthy cells as well as cancer cells. Report any side effects. Continue your course of treatment even though you feel ill unless your doctor  tells you to stop. In some cases, you may be given additional medicines to help with side effects. Follow all directions for their use. Call your doctor or health care professional for advice if you get a fever, chills or sore throat, or other symptoms of a cold or flu. Do not treat yourself. This drug decreases your body's ability to fight infections. Try to avoid being around people who are sick. This medicine may increase your risk to bruise or bleed. Call your doctor or health care professional if you notice any unusual bleeding. Be careful brushing and flossing your teeth or using a toothpick because you may get an infection or bleed more easily. If you have any dental work done, tell your dentist you are receiving this medicine. Avoid taking products that contain aspirin, acetaminophen, ibuprofen, naproxen, or ketoprofen unless instructed by your doctor. These medicines may hide a fever. Do not become pregnant while taking this medicine. Women should inform their doctor if they wish to become pregnant or think they might be pregnant. There is a potential for serious side effects to an unborn child. Talk to your health care professional or pharmacist for more information. Do not breast-feed an infant while taking this medicine. Drink fluids as directed while you are taking this medicine. This will help protect your kidneys. Call your doctor or health care professional if you get diarrhea. Do not treat yourself. What side effects may I notice from receiving this medicine? Side effects that you should report to your doctor or health care professional as soon as possible: -allergic reactions like skin rash, itching or hives, swelling of the face, lips, or tongue -signs of infection - fever or chills, cough, sore throat, pain or difficulty passing urine -signs of decreased platelets or bleeding - bruising, pinpoint red spots on the skin, black, tarry stools, nosebleeds -signs of decreased red blood  cells - unusually weak or tired, fainting spells, lightheadedness -breathing problems -changes in hearing -gout pain -low blood counts - This drug may decrease the number of white blood cells, red blood cells and platelets. You may be at increased risk for infections and bleeding. -nausea and vomiting -pain, swelling, redness or irritation at the injection site -pain, tingling, numbness in the hands or feet -problems with balance, movement -trouble passing urine or change in the amount of urine Side effects that usually do not require medical attention (report to your doctor or health care professional if they continue or are bothersome): -changes in vision -loss of appetite -metallic taste in the mouth or changes in taste This list may not describe all possible side effects. Call your doctor for medical advice about side effects. You may report side effects to FDA at 1-800-FDA-1088. Where should I keep my medicine? This drug is given in a hospital or clinic and will not be stored at home. NOTE: This sheet is a summary. It may not cover all possible information. If you have questions about this medicine, talk to your doctor, pharmacist, or health care provider.  2012, [employer]. (06/14/2007 2:40:54 PM)      Cisplatin injection What is this medicine? CISPLATIN (SIS pla tin) is a chemotherapy drug. It targets fast dividing cells, like cancer cells, and causes these  cells to die. This medicine is used to treat many types of cancer like bladder, ovarian, and testicular cancers. This medicine may be used for other purposes; ask your health care provider or pharmacist if you have questions. What should I tell my health care provider before I take this medicine? They need to know if you have any of these conditions: -blood disorders -hearing problems -kidney disease -recent or ongoing radiation therapy -an unusual or allergic reaction to cisplatin, carboplatin, other  chemotherapy, other medicines, foods, dyes, or preservatives -pregnant or trying to get pregnant -breast-feeding How should I use this medicine? This drug is given as an infusion into a vein. It is administered in a hospital or clinic by a specially trained health care professional. Talk to your pediatrician regarding the use of this medicine in children. Special care may be needed. Overdosage: If you think you have taken too much of this medicine contact a poison control center or emergency room at once. NOTE: This medicine is only for you. Do not share this medicine with others. What if I miss a dose? It is important not to miss a dose. Call your doctor or health care professional if you are unable to keep an appointment. What may interact with this medicine? -dofetilide -foscarnet -medicines for seizures -medicines to increase blood counts like filgrastim, pegfilgrastim, sargramostim -probenecid -pyridoxine used with altretamine -rituximab -some antibiotics like amikacin, gentamicin, neomycin, polymyxin B, streptomycin, tobramycin -sulfinpyrazone -vaccines -zalcitabine Talk to your doctor or health care professional before taking any of these medicines: -acetaminophen -aspirin -ibuprofen -ketoprofen -naproxen This list may not describe all possible interactions. Give your health care provider a list of all the medicines, herbs, non-prescription drugs, or dietary supplements you use. Also tell them if you smoke, drink alcohol, or use illegal drugs. Some items may interact with your medicine. What should I watch for while using this medicine? Your condition will be monitored carefully while you are receiving this medicine. You will need important blood work done while you are taking this medicine. This drug may make you feel generally unwell. This is not uncommon, as chemotherapy can affect healthy cells as well as cancer cells. Report any side effects. Continue your course of  treatment even though you feel ill unless your doctor tells you to stop. In some cases, you may be given additional medicines to help with side effects. Follow all directions for their use. Call your doctor or health care professional for advice if you get a fever, chills or sore throat, or other symptoms of a cold or flu. Do not treat yourself. This drug decreases your body's ability to fight infections. Try to avoid being around people who are sick. This medicine may increase your risk to bruise or bleed. Call your doctor or health care professional if you notice any unusual bleeding. Be careful brushing and flossing your teeth or using a toothpick because you may get an infection or bleed more easily. If you have any dental work done, tell your dentist you are receiving this medicine. Avoid taking products that contain aspirin, acetaminophen, ibuprofen, naproxen, or ketoprofen unless instructed by your doctor. These medicines may hide a fever. Do not become pregnant while taking this medicine. Women should inform their doctor if they wish to become pregnant or think they might be pregnant. There is a potential for serious side effects to an unborn child. Talk to your health care professional or pharmacist for more information. Do not breast-feed an infant while taking this medicine. Drink  fluids as directed while you are taking this medicine. This will help protect your kidneys. Call your doctor or health care professional if you get diarrhea. Do not treat yourself. What side effects may I notice from receiving this medicine? Side effects that you should report to your doctor or health care professional as soon as possible: -allergic reactions like skin rash, itching or hives, swelling of the face, lips, or tongue -signs of infection - fever or chills, cough, sore throat, pain or difficulty passing urine -signs of decreased platelets or bleeding - bruising, pinpoint red spots on the skin, black,  tarry stools, nosebleeds -signs of decreased red blood cells - unusually weak or tired, fainting spells, lightheadedness -breathing problems -changes in hearing -gout pain -low blood counts - This drug may decrease the number of white blood cells, red blood cells and platelets. You may be at increased risk for infections and bleeding. -nausea and vomiting -pain, swelling, redness or irritation at the injection site -pain, tingling, numbness in the hands or feet -problems with balance, movement -trouble passing urine or change in the amount of urine Side effects that usually do not require medical attention (report to your doctor or health care professional if they continue or are bothersome): -changes in vision -loss of appetite -metallic taste in the mouth or changes in taste This list may not describe all possible side effects. Call your doctor for medical advice about side effects. You may report side effects to FDA at 1-800-FDA-1088. Where should I keep my medicine? This drug is given in a hospital or clinic and will not be stored at home. NOTE: This sheet is a summary. It may not cover all possible information. If you have questions about this medicine, talk to your doctor, pharmacist, or health care provider.  2012, [employer] Standard. (06/14/2007 2:40:54 PM)    Fondaparinux Injections Fondaparinux (Arixtra) injection is a blood thinner (anticoagulant) medication that "thins" the blood and helps to prevent blood clots from developing in your veins. If blood clots develop and are left untreated, they can travel to your heart, lungs, or brain. These clots can cause serious illness and can be fatal. Blood clots can form due to:  Prolonged immobility such as:   People who cannot get out of bed (bedridden).   Sitting for long periods of time, such as long airplane flights.   Surgery, especially operations that involve:   Orthopedic (bones and joints).   The abdomen.    Obesity.   Certain heart conditions.   Certain cancers.   Hormone Replacement Therapy (HRT) or birth control pills (uncommon but possible).  WHAT IF A BLOOD CLOT HAS ALREADY DEVELOPED? If a blood clot has developed, caregivers can use fondaparinux to treat clots in the legs or lungs. In addition to fondaparinux, another blood thinner called warfarin (Coumadin) will be started 2 to 3 days after fondaparinux has been started. warfarin is a pill and must be taken simultaneously with fondaparinux until the warfarin has begun to work.  Your caregiver may use a blood test called an INR or International Normalization Ratio to know when there is enough warfarin in the blood. Fondaparinux may be stopped when the INR level is between 2.0 to 3.0. This means that your blood is at the necessary and best level to treat the clots that have formed.  OTHER FONDAPARINUX USES In [place], fondaparinux is sometimes used to help when there is not enough blood flow to the heart. HOME CARE INSTRUCTIONS   Fondaparinux should not  be used if you have allergies to the medication, heparin or pork products.   Before giving your medication, make sure the solution is a clear and colorless. If your medication becomes discolored or has particles in the bottle, do not use it. Notify your caregiver right away.   Keep your medication safely stored at room temperatures.   You will be instructed by your caregiver how to give fondaparinux injections.  HOW TO INJECT FONDAPARINUX 1. Fondaparinux is a shot that is given in the stomach (abdomen). Change (rotate) the injection site each time you give yourself a shot.  2. Twist the plunger cap and remove it.  3. Hold the syringe with either hand and use your other hand to twist the rigid needle guard (covers the needle) counter-clockwise. Pull the rigid needle guard straight off the needle. Discard the needle guard.  4. When using the pre-filled syringes, do not expel the air bubble  from the syringe before the injection. The air bubble helps to inject all of the medication when it is given.  5. The injection will be given just underneath the skin into the fat of the belly (subcutaneously). The shots should be injected around the outside of the belly (abdominal wall). Change the place where you give the shot each time. The whole length of the needle should be introduced into a skin fold held between the thumb and forefinger; the skin fold should be held throughout the injection.  6. Inject fondaparinux by pushing the plunger to the bottom of the syringe. Push the plunger rod firmly with your thumb as far as it will go. This will ensure you have injected all of the medication. Do not rub the injection site after completion of the injection. This increases bruising.  7. Remove the syringe from the injection site, keeping your finger on the plunger rod. Be careful not to stick yourself or others.  8. Fondaparinux injection pre-filled syringes and graduated pre-filled syringes are available with a system that shields the needle after injection. After injection and the syringe is empty, set off the safety system by firmly pushing the plunger rod. The protective sleeve will automatically cover the needle and you can hear a click. The click means your needle is safely covered.  9. Get rid of the syringe in the nearest needle box (sharps container).  FONDAPARINUX WARNINGS Problems with fondaparinux are rare. However, side effects and complications can occur, such as:  Serious side effects can include:   If you are taking fondaparinux or other low-molecular-weight heparins and have an epidural or spinal anesthesia or a spinal tap, you are at risk of developing a blood clot in or around the spine. This condition could result in long-term or permanent paralysis.   A condition called heparin-induced thrombocytopenia (HIT) can occur with fondaparinux use. This is where your platelet count drops.  HIT is rare. However, your caregiver will perform lab work to check your blood levels. If you have had this condition before, you should tell your caregiver.   Because fondaparinux thins your blood, complications may include bleeding into the bowel or kidneys.   Mild side effects may include:   Bruising around the injection site.   Mild irritation at the site of injection, such as pain, itching, or redness of skin.  SEEK IMMEDIATE MEDICAL CARE IF:  You develop bleeding problems such as:   Vomiting blood or coughing up blood.   Blood in your urine.   Blood in your stool or your stool has  a dark, tarry, or coffee ground appearance.   You develop any rashes on your skin, such as:   Multiple red "dots."   Long red streaks.   You have large areas of bruising on your skin.   A sudden nosebleed that does not stop after 15 to 20 minutes.   You have any worsening of the condition that led you to fondaparinux treatment.   You develop chest pain or shortness of breath. If the chest pain or shortness of breath is severe, call your local emergency service immediately!  MAKE SURE YOU:   Understand these instructions.   Will watch your condition.   Will get help right away if you are not doing well or get worse.  Document Released: 06/05/2008 Document Revised: 02/26/2011 Document Reviewed: 06/05/2008 [facility] Patient Information 2012 [place], [place].

## 2011-09-01 ENCOUNTER — Ambulatory Visit (HOSPITAL_BASED_OUTPATIENT_CLINIC_OR_DEPARTMENT_OTHER): Payer: Self-pay

## 2011-09-01 VITALS — BP 138/84 | HR 94 | Temp 97.0°F

## 2011-09-01 DIAGNOSIS — C6290 Malignant neoplasm of unspecified testis, unspecified whether descended or undescended: Secondary | ICD-10-CM

## 2011-09-01 DIAGNOSIS — Z5111 Encounter for antineoplastic chemotherapy: Secondary | ICD-10-CM

## 2011-09-01 MED ORDER — SODIUM CHLORIDE 0.9 % IV SOLN
20.0000 mg/m2 | Freq: Once | INTRAVENOUS | Status: AC
  Administered 2011-09-01: 46 mg via INTRAVENOUS
  Filled 2011-09-01: qty 46

## 2011-09-01 MED ORDER — SODIUM CHLORIDE 0.9 % IV SOLN
100.0000 mg/m2 | Freq: Once | INTRAVENOUS | Status: AC
  Administered 2011-09-01: 230 mg via INTRAVENOUS
  Filled 2011-09-01: qty 11.5

## 2011-09-01 MED ORDER — DEXAMETHASONE SODIUM PHOSPHATE 4 MG/ML IJ SOLN
20.0000 mg | Freq: Once | INTRAMUSCULAR | Status: AC
  Administered 2011-09-01: 20 mg via INTRAVENOUS

## 2011-09-01 MED ORDER — SODIUM CHLORIDE 0.9 % IJ SOLN
10.0000 mL | INTRAMUSCULAR | Status: DC | PRN
  Administered 2011-09-01: 10 mL
  Filled 2011-09-01: qty 10

## 2011-09-01 MED ORDER — DEXTROSE-NACL 5-0.45 % IV SOLN
Freq: Once | INTRAVENOUS | Status: AC
  Administered 2011-09-01: 09:00:00 via INTRAVENOUS
  Filled 2011-09-01: qty 10

## 2011-09-01 MED ORDER — SODIUM CHLORIDE 0.9 % IV SOLN
Freq: Once | INTRAVENOUS | Status: AC
  Administered 2011-09-01: 09:00:00 via INTRAVENOUS

## 2011-09-01 MED ORDER — HEPARIN SOD (PORK) LOCK FLUSH 100 UNIT/ML IV SOLN
500.0000 [IU] | Freq: Once | INTRAVENOUS | Status: AC | PRN
  Administered 2011-09-01: 500 [IU]
  Filled 2011-09-01: qty 5

## 2011-09-01 NOTE — Patient Instructions (Signed)
[facility] Discharge Instructions for Patients Receiving Chemotherapy  Today you received the following chemotherapy agents Cisplatin.  To help prevent nausea and vomiting after your treatment, we encourage you to take your nausea medication.  If you develop nausea and vomiting that is not controlled by your nausea medication, call the clinic. If it is after clinic hours your family physician or the after hours number for the clinic or go to the Emergency Department.   BELOW ARE SYMPTOMS THAT SHOULD BE REPORTED IMMEDIATELY:  *FEVER GREATER THAN 100.5 F  *CHILLS WITH OR WITHOUT FEVER  NAUSEA AND VOMITING THAT IS NOT CONTROLLED WITH YOUR NAUSEA MEDICATION  *UNUSUAL SHORTNESS OF BREATH  *UNUSUAL BRUISING OR BLEEDING  TENDERNESS IN MOUTH AND THROAT WITH OR WITHOUT PRESENCE OF ULCERS  *URINARY PROBLEMS  *BOWEL PROBLEMS  UNUSUAL RASH Items with * indicate a potential emergency and should be followed up as soon as possible.  One of the nurses will contact you 24 hours after your treatment. Please let the nurse know about any problems that you may have experienced. Feel free to call the clinic you have any questions or concerns. The clinic phone number is [phone].   I have been informed and understand all the instructions given to me. I know to contact the clinic, my physician, or go to the Emergency Department if any problems should occur. I do not have any questions at this time, but understand that I may call the clinic during office hours   should I have any questions or need assistance in obtaining follow up care.    __________________________________________  _____________  __________ Signature of Patient or Authorized Representative            Date                   Time    __________________________________________ Nurse's Signature    

## 2011-09-01 NOTE — Progress Notes (Signed)
CC:   Devin Finley, M.D.  DIAGNOSIS: 1. Recurrent testicular seminoma. 2. Asymptomatic pulmonary embolism.  CURRENT THERAPY: 1. Patient is status post 3 cycles of etoposide/platinum. 2. Arixtra 10 mg subcutaneous daily.  INTERVAL HISTORY:  Devin Finley comes in for his followup.  He is doing okay with the Arixtra.  He has had no problems with cough.  He is doing the injections himself.  He has had no fever, sweats or chills.  He has had no nausea, vomiting.  He has had no headache.  He has had no abdominal pain.  He was found to have an asymptomatic pulmonary embolism when we did his last set of scans.  He is on Arixtra for this.  This was found back in May.  Again, he is on Arixtra and doing well with this.  He did have a DVT in the right leg.  I want him on Arixtra for a good 6 months.  He gets his last cycle of chemotherapy this week.  He has responded very nicely.  His alpha-fetoprotein was 4.8 and beta HCG was less than 0.5.  When we did his scans, it showed marked improvement of his lymphadenopathy.  He has had no bleeding.  He has had no problems with bowels or bladder.  PHYSICAL EXAM:  General:  This is a very muscular, African American gentleman in no obvious distress.  Vital signs:  97.2, pulse 83, respiratory rate 22, blood pressure 139/92.  Weight is 237.  Head and neck exam:  Shows a normocephalic, atraumatic skull.  There are no ocular or oral lesions.  There are no palpable cervical or supraclavicular lymph nodes.  Lungs:  Clear to percussion and auscultation bilaterally.  Cardiac examination:  Regular rate and rhythm with a normal S1, S2.  There are no murmurs, rubs, or bruits.  Abdominal exam soft with good bowel sounds.  There is no palpable abdominal mass. There is no fluid wave.  There is no palpable hepatosplenomegaly.  He has a well-healed left inguinal orchiectomy scar.  Extremities:  Show no clubbing, cyanosis or edema.  No palpable venous cord is  noted in the legs.  Skin:  No rash, ecchymosis or petechia.  OTHER LABORATORY STUDIES:  White cell count 3.1, hemoglobin 12.8, hematocrit 36.6, platelet count is 177.  IMPRESSION:  Devin Finley is a 31 year old gentleman with recurrent seminoma of the left testicle.  He will complete his systemic chemotherapy this week.  We will then plan for a followup CT scan.  We will have to see whether or not there is any indication for retroperitoneal lymph node dissection.  One would think that this would not be the case given that he did not have bulky disease to begin with.  We will also have to manage his pulmonary embolism.  We will get him set up with a CT scan to look for this.  We will have him come back to see [place] in another month or so.    ______________________________ Josph Macho, M.D. PRE/MEDQ  D:  08/31/2011  T:  09/01/2011  Job:  [id number]

## 2011-09-02 ENCOUNTER — Ambulatory Visit (HOSPITAL_BASED_OUTPATIENT_CLINIC_OR_DEPARTMENT_OTHER): Payer: Self-pay

## 2011-09-02 VITALS — BP 130/76 | HR 71 | Temp 97.2°F

## 2011-09-02 DIAGNOSIS — C6290 Malignant neoplasm of unspecified testis, unspecified whether descended or undescended: Secondary | ICD-10-CM

## 2011-09-02 DIAGNOSIS — Z5111 Encounter for antineoplastic chemotherapy: Secondary | ICD-10-CM

## 2011-09-02 MED ORDER — DEXAMETHASONE SODIUM PHOSPHATE 4 MG/ML IJ SOLN
20.0000 mg | Freq: Once | INTRAMUSCULAR | Status: AC
  Administered 2011-09-02: 20 mg via INTRAVENOUS

## 2011-09-02 MED ORDER — SODIUM CHLORIDE 0.9 % IJ SOLN
10.0000 mL | INTRAMUSCULAR | Status: DC | PRN
  Administered 2011-09-02: 10 mL
  Filled 2011-09-02: qty 10

## 2011-09-02 MED ORDER — SODIUM CHLORIDE 0.9 % IV SOLN
100.0000 mg/m2 | Freq: Once | INTRAVENOUS | Status: AC
  Administered 2011-09-02: 230 mg via INTRAVENOUS
  Filled 2011-09-02: qty 11.5

## 2011-09-02 MED ORDER — HEPARIN SOD (PORK) LOCK FLUSH 100 UNIT/ML IV SOLN
500.0000 [IU] | Freq: Once | INTRAVENOUS | Status: AC | PRN
  Administered 2011-09-02: 500 [IU]
  Filled 2011-09-02: qty 5

## 2011-09-02 MED ORDER — DEXTROSE-NACL 5-0.45 % IV SOLN
Freq: Once | INTRAVENOUS | Status: AC
  Administered 2011-09-02: 09:00:00 via INTRAVENOUS
  Filled 2011-09-02: qty 10

## 2011-09-02 MED ORDER — SODIUM CHLORIDE 0.9 % IV SOLN
Freq: Once | INTRAVENOUS | Status: AC
  Administered 2011-09-02: 09:00:00 via INTRAVENOUS

## 2011-09-02 MED ORDER — PALONOSETRON HCL INJECTION 0.25 MG/5ML
0.2500 mg | Freq: Once | INTRAVENOUS | Status: AC
  Administered 2011-09-02: 0.25 mg via INTRAVENOUS

## 2011-09-02 MED ORDER — SODIUM CHLORIDE 0.9 % IV SOLN
20.0000 mg/m2 | Freq: Once | INTRAVENOUS | Status: AC
  Administered 2011-09-02: 46 mg via INTRAVENOUS
  Filled 2011-09-02: qty 46

## 2011-09-02 MED ORDER — SODIUM CHLORIDE 0.9 % IJ SOLN
3.0000 mL | INTRAMUSCULAR | Status: DC | PRN
  Filled 2011-09-02: qty 10

## 2011-09-02 NOTE — Patient Instructions (Signed)
Cisplatin injection What is this medicine? CISPLATIN (SIS pla tin) is a chemotherapy drug. It targets fast dividing cells, like cancer cells, and causes these cells to die. This medicine is used to treat many types of cancer like bladder, ovarian, and testicular cancers. This medicine may be used for other purposes; ask your health care provider or pharmacist if you have questions. What should I tell my health care provider before I take this medicine? They need to know if you have any of these conditions: -blood disorders -hearing problems -kidney disease -recent or ongoing radiation therapy -an unusual or allergic reaction to cisplatin, carboplatin, other chemotherapy, other medicines, foods, dyes, or preservatives -pregnant or trying to get pregnant -breast-feeding How should I use this medicine? This drug is given as an infusion into a vein. It is administered in a hospital or clinic by a specially trained health care professional. Talk to your pediatrician regarding the use of this medicine in children. Special care may be needed. Overdosage: If you think you have taken too much of this medicine contact a poison control center or emergency room at once. NOTE: This medicine is only for you. Do not share this medicine with others. What if I miss a dose? It is important not to miss a dose. Call your doctor or health care professional if you are unable to keep an appointment. What may interact with this medicine? -dofetilide -foscarnet -medicines for seizures -medicines to increase blood counts like filgrastim, pegfilgrastim, sargramostim -probenecid -pyridoxine used with altretamine -rituximab -some antibiotics like amikacin, gentamicin, neomycin, polymyxin B, streptomycin, tobramycin -sulfinpyrazone -vaccines -zalcitabine Talk to your doctor or health care professional before taking any of these medicines: -acetaminophen -aspirin -ibuprofen -ketoprofen -naproxen This list may  not describe all possible interactions. Give your health care provider a list of all the medicines, herbs, non-prescription drugs, or dietary supplements you use. Also tell them if you smoke, drink alcohol, or use illegal drugs. Some items may interact with your medicine. What should I watch for while using this medicine? Your condition will be monitored carefully while you are receiving this medicine. You will need important blood work done while you are taking this medicine. This drug may make you feel generally unwell. This is not uncommon, as chemotherapy can affect healthy cells as well as cancer cells. Report any side effects. Continue your course of treatment even though you feel ill unless your doctor tells you to stop. In some cases, you may be given additional medicines to help with side effects. Follow all directions for their use. Call your doctor or health care professional for advice if you get a fever, chills or sore throat, or other symptoms of a cold or flu. Do not treat yourself. This drug decreases your body's ability to fight infections. Try to avoid being around people who are sick. This medicine may increase your risk to bruise or bleed. Call your doctor or health care professional if you notice any unusual bleeding. Be careful brushing and flossing your teeth or using a toothpick because you may get an infection or bleed more easily. If you have any dental work done, tell your dentist you are receiving this medicine. Avoid taking products that contain aspirin, acetaminophen, ibuprofen, naproxen, or ketoprofen unless instructed by your doctor. These medicines may hide a fever. Do not become pregnant while taking this medicine. Women should inform their doctor if they wish to become pregnant or think they might be pregnant. There is a potential for serious side effects to   an unborn child. Talk to your health care professional or pharmacist for more information. Do not breast-feed an  infant while taking this medicine. Drink fluids as directed while you are taking this medicine. This will help protect your kidneys. Call your doctor or health care professional if you get diarrhea. Do not treat yourself. What side effects may I notice from receiving this medicine? Side effects that you should report to your doctor or health care professional as soon as possible: -allergic reactions like skin rash, itching or hives, swelling of the face, lips, or tongue -signs of infection - fever or chills, cough, sore throat, pain or difficulty passing urine -signs of decreased platelets or bleeding - bruising, pinpoint red spots on the skin, black, tarry stools, nosebleeds -signs of decreased red blood cells - unusually weak or tired, fainting spells, lightheadedness -breathing problems -changes in hearing -gout pain -low blood counts - This drug may decrease the number of white blood cells, red blood cells and platelets. You may be at increased risk for infections and bleeding. -nausea and vomiting -pain, swelling, redness or irritation at the injection site -pain, tingling, numbness in the hands or feet -problems with balance, movement -trouble passing urine or change in the amount of urine Side effects that usually do not require medical attention (report to your doctor or health care professional if they continue or are bothersome): -changes in vision -loss of appetite -metallic taste in the mouth or changes in taste This list may not describe all possible side effects. Call your doctor for medical advice about side effects. You may report side effects to FDA at 1-800-FDA-1088. Where should I keep my medicine? This drug is given in a hospital or clinic and will not be stored at home. NOTE: This sheet is a summary. It may not cover all possible information. If you have questions about this medicine, talk to your doctor, pharmacist, or health care provider.  2012, [employer]. (06/14/2007 2:40:54 PM) 

## 2011-09-03 ENCOUNTER — Ambulatory Visit (HOSPITAL_BASED_OUTPATIENT_CLINIC_OR_DEPARTMENT_OTHER): Payer: Self-pay

## 2011-09-03 VITALS — BP 124/77 | HR 73 | Temp 97.4°F

## 2011-09-03 DIAGNOSIS — C6290 Malignant neoplasm of unspecified testis, unspecified whether descended or undescended: Secondary | ICD-10-CM

## 2011-09-03 DIAGNOSIS — Z5111 Encounter for antineoplastic chemotherapy: Secondary | ICD-10-CM

## 2011-09-03 MED ORDER — HEPARIN SOD (PORK) LOCK FLUSH 100 UNIT/ML IV SOLN
500.0000 [IU] | Freq: Once | INTRAVENOUS | Status: AC | PRN
  Administered 2011-09-03: 500 [IU]
  Filled 2011-09-03: qty 5

## 2011-09-03 MED ORDER — SODIUM CHLORIDE 0.9 % IJ SOLN
10.0000 mL | INTRAMUSCULAR | Status: DC | PRN
  Filled 2011-09-03: qty 10

## 2011-09-03 MED ORDER — DEXAMETHASONE SODIUM PHOSPHATE 4 MG/ML IJ SOLN
20.0000 mg | Freq: Once | INTRAMUSCULAR | Status: AC
  Administered 2011-09-03: 20 mg via INTRAVENOUS

## 2011-09-03 MED ORDER — POTASSIUM CHLORIDE 2 MEQ/ML IV SOLN: Freq: Once | INTRAVENOUS | Status: DC

## 2011-09-03 MED ORDER — SODIUM CHLORIDE 0.9 % IV SOLN
100.0000 mg/m2 | Freq: Once | INTRAVENOUS | Status: AC
  Administered 2011-09-03: 230 mg via INTRAVENOUS
  Filled 2011-09-03: qty 11.5

## 2011-09-03 MED ORDER — SODIUM CHLORIDE 0.9 % IV SOLN
20.0000 mg/m2 | Freq: Once | INTRAVENOUS | Status: AC
  Administered 2011-09-03: 46 mg via INTRAVENOUS
  Filled 2011-09-03: qty 46

## 2011-09-03 MED ORDER — HEPARIN SOD (PORK) LOCK FLUSH 100 UNIT/ML IV SOLN
500.0000 [IU] | Freq: Once | INTRAVENOUS | Status: DC | PRN
  Filled 2011-09-03: qty 5

## 2011-09-03 MED ORDER — SODIUM CHLORIDE 0.9 % IV SOLN: Freq: Once | INTRAVENOUS | Status: DC

## 2011-09-03 MED ORDER — SODIUM CHLORIDE 0.9 % IV SOLN
Freq: Once | INTRAVENOUS | Status: AC
  Administered 2011-09-03: 08:00:00 via INTRAVENOUS

## 2011-09-03 MED ORDER — SODIUM CHLORIDE 0.9 % IJ SOLN
10.0000 mL | INTRAMUSCULAR | Status: DC | PRN
  Administered 2011-09-03: 10 mL
  Filled 2011-09-03: qty 10

## 2011-09-03 MED ORDER — POTASSIUM CHLORIDE 2 MEQ/ML IV SOLN
Freq: Once | INTRAVENOUS | Status: AC
  Administered 2011-09-03: 09:00:00 via INTRAVENOUS
  Filled 2011-09-03: qty 10

## 2011-09-03 MED ORDER — PALONOSETRON HCL INJECTION 0.25 MG/5ML: 0.2500 mg | Freq: Once | INTRAVENOUS | Status: DC

## 2011-09-03 NOTE — Patient Instructions (Addendum)
Etoposide, VP-16 injection What is this medicine? ETOPOSIDE, VP-16 (e toe POE side) is a chemotherapy drug. It is used to treat testicular cancer, lung cancer, and other cancers. This medicine may be used for other purposes; ask your health care provider or pharmacist if you have questions. What should I tell my health care provider before I take this medicine? They need to know if you have any of these conditions: -infection -kidney disease -low blood counts, like low white cell, platelet, or red cell counts -an unusual or allergic reaction to etoposide, other chemotherapeutic agents, other medicines, foods, dyes, or preservatives -pregnant or trying to get pregnant -breast-feeding How should I use this medicine? This medicine is for infusion into a vein. It is administered in a hospital or clinic by a specially trained health care professional. Talk to your pediatrician regarding the use of this medicine in children. Special care may be needed. Overdosage: If you think you have taken too much of this medicine contact a poison control center or emergency room at once. NOTE: This medicine is only for you. Do not share this medicine with others. What if I miss a dose? It is important not to miss your dose. Call your doctor or health care professional if you are unable to keep an appointment. What may interact with this medicine? -cyclosporine -medicines to increase blood counts like filgrastim, pegfilgrastim, sargramostim -vaccines This list may not describe all possible interactions. Give your health care provider a list of all the medicines, herbs, non-prescription drugs, or dietary supplements you use. Also tell them if you smoke, drink alcohol, or use illegal drugs. Some items may interact with your medicine. What should I watch for while using this medicine? Visit your doctor for checks on your progress. This drug may make you feel generally unwell. This is not uncommon, as chemotherapy  can affect healthy cells as well as cancer cells. Report any side effects. Continue your course of treatment even though you feel ill unless your doctor tells you to stop. In some cases, you may be given additional medicines to help with side effects. Follow all directions for their use. Call your doctor or health care professional for advice if you get a fever, chills or sore throat, or other symptoms of a cold or flu. Do not treat yourself. This drug decreases your body's ability to fight infections. Try to avoid being around people who are sick. This medicine may increase your risk to bruise or bleed. Call your doctor or health care professional if you notice any unusual bleeding. Be careful brushing and flossing your teeth or using a toothpick because you may get an infection or bleed more easily. If you have any dental work done, tell your dentist you are receiving this medicine. Avoid taking products that contain aspirin, acetaminophen, ibuprofen, naproxen, or ketoprofen unless instructed by your doctor. These medicines may hide a fever. Do not become pregnant while taking this medicine. Women should inform their doctor if they wish to become pregnant or think they might be pregnant. There is a potential for serious side effects to an unborn child. Talk to your health care professional or pharmacist for more information. Do not breast-feed an infant while taking this medicine. What side effects may I notice from receiving this medicine? Side effects that you should report to your doctor or health care professional as soon as possible: -allergic reactions like skin rash, itching or hives, swelling of the face, lips, or tongue -low blood counts - this medicine   may decrease the number of white blood cells, red blood cells and platelets. You may be at increased risk for infections and bleeding. -signs of infection - fever or chills, cough, sore throat, pain or difficulty passing urine -signs of  decreased platelets or bleeding - bruising, pinpoint red spots on the skin, black, tarry stools, blood in the urine -signs of decreased red blood cells - unusually weak or tired, fainting spells, lightheadedness -breathing problems -changes in vision -mouth or throat sores or ulcers -pain, redness, swelling or irritation at the injection site -pain, tingling, numbness in the hands or feet -redness, blistering, peeling or loosening of the skin, including inside the mouth -seizures -vomiting Side effects that usually do not require medical attention (report to your doctor or health care professional if they continue or are bothersome): -diarrhea -hair loss -loss of appetite -nausea -stomach pain This list may not describe all possible side effects. Call your doctor for medical advice about side effects. You may report side effects to FDA at 1-800-FDA-1088. Where should I keep my medicine? This drug is given in a hospital or clinic and will not be stored at home. NOTE: This sheet is a summary. It may not cover all possible information. If you have questions about this medicine, talk to your doctor, pharmacist, or health care provider.  2012, [employer]. (07/11/2007 5:24:12 PM)Cisplatin injection What is this medicine? CISPLATIN (SIS pla tin) is a chemotherapy drug. It targets fast dividing cells, like cancer cells, and causes these cells to die. This medicine is used to treat many types of cancer like bladder, ovarian, and testicular cancers. This medicine may be used for other purposes; ask your health care provider or pharmacist if you have questions. What should I tell my health care provider before I take this medicine? They need to know if you have any of these conditions: -blood disorders -hearing problems -kidney disease -recent or ongoing radiation therapy -an unusual or allergic reaction to cisplatin, carboplatin, other chemotherapy, other medicines, foods, dyes, or  preservatives -pregnant or trying to get pregnant -breast-feeding How should I use this medicine? This drug is given as an infusion into a vein. It is administered in a hospital or clinic by a specially trained health care professional. Talk to your pediatrician regarding the use of this medicine in children. Special care may be needed. Overdosage: If you think you have taken too much of this medicine contact a poison control center or emergency room at once. NOTE: This medicine is only for you. Do not share this medicine with others. What if I miss a dose? It is important not to miss a dose. Call your doctor or health care professional if you are unable to keep an appointment. What may interact with this medicine? -dofetilide -foscarnet -medicines for seizures -medicines to increase blood counts like filgrastim, pegfilgrastim, sargramostim -probenecid -pyridoxine used with altretamine -rituximab -some antibiotics like amikacin, gentamicin, neomycin, polymyxin B, streptomycin, tobramycin -sulfinpyrazone -vaccines -zalcitabine Talk to your doctor or health care professional before taking any of these medicines: -acetaminophen -aspirin -ibuprofen -ketoprofen -naproxen This list may not describe all possible interactions. Give your health care provider a list of all the medicines, herbs, non-prescription drugs, or dietary supplements you use. Also tell them if you smoke, drink alcohol, or use illegal drugs. Some items may interact with your medicine. What should I watch for while using this medicine? Your condition will be monitored carefully while you are receiving this medicine. You will need important blood work done while   you are taking this medicine. This drug may make you feel generally unwell. This is not uncommon, as chemotherapy can affect healthy cells as well as cancer cells. Report any side effects. Continue your course of treatment even though you feel ill unless your doctor  tells you to stop. In some cases, you may be given additional medicines to help with side effects. Follow all directions for their use. Call your doctor or health care professional for advice if you get a fever, chills or sore throat, or other symptoms of a cold or flu. Do not treat yourself. This drug decreases your body's ability to fight infections. Try to avoid being around people who are sick. This medicine may increase your risk to bruise or bleed. Call your doctor or health care professional if you notice any unusual bleeding. Be careful brushing and flossing your teeth or using a toothpick because you may get an infection or bleed more easily. If you have any dental work done, tell your dentist you are receiving this medicine. Avoid taking products that contain aspirin, acetaminophen, ibuprofen, naproxen, or ketoprofen unless instructed by your doctor. These medicines may hide a fever. Do not become pregnant while taking this medicine. Women should inform their doctor if they wish to become pregnant or think they might be pregnant. There is a potential for serious side effects to an unborn child. Talk to your health care professional or pharmacist for more information. Do not breast-feed an infant while taking this medicine. Drink fluids as directed while you are taking this medicine. This will help protect your kidneys. Call your doctor or health care professional if you get diarrhea. Do not treat yourself. What side effects may I notice from receiving this medicine? Side effects that you should report to your doctor or health care professional as soon as possible: -allergic reactions like skin rash, itching or hives, swelling of the face, lips, or tongue -signs of infection - fever or chills, cough, sore throat, pain or difficulty passing urine -signs of decreased platelets or bleeding - bruising, pinpoint red spots on the skin, black, tarry stools, nosebleeds -signs of decreased red blood  cells - unusually weak or tired, fainting spells, lightheadedness -breathing problems -changes in hearing -gout pain -low blood counts - This drug may decrease the number of white blood cells, red blood cells and platelets. You may be at increased risk for infections and bleeding. -nausea and vomiting -pain, swelling, redness or irritation at the injection site -pain, tingling, numbness in the hands or feet -problems with balance, movement -trouble passing urine or change in the amount of urine Side effects that usually do not require medical attention (report to your doctor or health care professional if they continue or are bothersome): -changes in vision -loss of appetite -metallic taste in the mouth or changes in taste This list may not describe all possible side effects. Call your doctor for medical advice about side effects. You may report side effects to FDA at 1-800-FDA-1088. Where should I keep my medicine? This drug is given in a hospital or clinic and will not be stored at home. NOTE: This sheet is a summary. It may not cover all possible information. If you have questions about this medicine, talk to your doctor, pharmacist, or health care provider.  2012, [employer]. (06/14/2007 2:40:54 PM)      Cisplatin injection What is this medicine? CISPLATIN (SIS pla tin) is a chemotherapy drug. It targets fast dividing cells, like cancer cells, and causes these   cells to die. This medicine is used to treat many types of cancer like bladder, ovarian, and testicular cancers. This medicine may be used for other purposes; ask your health care provider or pharmacist if you have questions. What should I tell my health care provider before I take this medicine? They need to know if you have any of these conditions: -blood disorders -hearing problems -kidney disease -recent or ongoing radiation therapy -an unusual or allergic reaction to cisplatin, carboplatin, other  chemotherapy, other medicines, foods, dyes, or preservatives -pregnant or trying to get pregnant -breast-feeding How should I use this medicine? This drug is given as an infusion into a vein. It is administered in a hospital or clinic by a specially trained health care professional. Talk to your pediatrician regarding the use of this medicine in children. Special care may be needed. Overdosage: If you think you have taken too much of this medicine contact a poison control center or emergency room at once. NOTE: This medicine is only for you. Do not share this medicine with others. What if I miss a dose? It is important not to miss a dose. Call your doctor or health care professional if you are unable to keep an appointment. What may interact with this medicine? -dofetilide -foscarnet -medicines for seizures -medicines to increase blood counts like filgrastim, pegfilgrastim, sargramostim -probenecid -pyridoxine used with altretamine -rituximab -some antibiotics like amikacin, gentamicin, neomycin, polymyxin B, streptomycin, tobramycin -sulfinpyrazone -vaccines -zalcitabine Talk to your doctor or health care professional before taking any of these medicines: -acetaminophen -aspirin -ibuprofen -ketoprofen -naproxen This list may not describe all possible interactions. Give your health care provider a list of all the medicines, herbs, non-prescription drugs, or dietary supplements you use. Also tell them if you smoke, drink alcohol, or use illegal drugs. Some items may interact with your medicine. What should I watch for while using this medicine? Your condition will be monitored carefully while you are receiving this medicine. You will need important blood work done while you are taking this medicine. This drug may make you feel generally unwell. This is not uncommon, as chemotherapy can affect healthy cells as well as cancer cells. Report any side effects. Continue your course of  treatment even though you feel ill unless your doctor tells you to stop. In some cases, you may be given additional medicines to help with side effects. Follow all directions for their use. Call your doctor or health care professional for advice if you get a fever, chills or sore throat, or other symptoms of a cold or flu. Do not treat yourself. This drug decreases your body's ability to fight infections. Try to avoid being around people who are sick. This medicine may increase your risk to bruise or bleed. Call your doctor or health care professional if you notice any unusual bleeding. Be careful brushing and flossing your teeth or using a toothpick because you may get an infection or bleed more easily. If you have any dental work done, tell your dentist you are receiving this medicine. Avoid taking products that contain aspirin, acetaminophen, ibuprofen, naproxen, or ketoprofen unless instructed by your doctor. These medicines may hide a fever. Do not become pregnant while taking this medicine. Women should inform their doctor if they wish to become pregnant or think they might be pregnant. There is a potential for serious side effects to an unborn child. Talk to your health care professional or pharmacist for more information. Do not breast-feed an infant while taking this medicine. Drink   fluids as directed while you are taking this medicine. This will help protect your kidneys. Call your doctor or health care professional if you get diarrhea. Do not treat yourself. What side effects may I notice from receiving this medicine? Side effects that you should report to your doctor or health care professional as soon as possible: -allergic reactions like skin rash, itching or hives, swelling of the face, lips, or tongue -signs of infection - fever or chills, cough, sore throat, pain or difficulty passing urine -signs of decreased platelets or bleeding - bruising, pinpoint red spots on the skin, black,  tarry stools, nosebleeds -signs of decreased red blood cells - unusually weak or tired, fainting spells, lightheadedness -breathing problems -changes in hearing -gout pain -low blood counts - This drug may decrease the number of white blood cells, red blood cells and platelets. You may be at increased risk for infections and bleeding. -nausea and vomiting -pain, swelling, redness or irritation at the injection site -pain, tingling, numbness in the hands or feet -problems with balance, movement -trouble passing urine or change in the amount of urine Side effects that usually do not require medical attention (report to your doctor or health care professional if they continue or are bothersome): -changes in vision -loss of appetite -metallic taste in the mouth or changes in taste This list may not describe all possible side effects. Call your doctor for medical advice about side effects. You may report side effects to FDA at 1-800-FDA-1088. Where should I keep my medicine? This drug is given in a hospital or clinic and will not be stored at home. NOTE: This sheet is a summary. It may not cover all possible information. If you have questions about this medicine, talk to your doctor, pharmacist, or health care provider.  2012, [employer]. (06/14/2007 2:40:54 PM)    Fondaparinux Injections Fondaparinux (Arixtra) injection is a blood thinner (anticoagulant) medication that "thins" the blood and helps to prevent blood clots from developing in your veins. If blood clots develop and are left untreated, they can travel to your heart, lungs, or brain. These clots can cause serious illness and can be fatal. Blood clots can form due to:  Prolonged immobility such as:   People who cannot get out of bed (bedridden).   Sitting for long periods of time, such as long airplane flights.   Surgery, especially operations that involve:   Orthopedic (bones and joints).   The abdomen.    Obesity.   Certain heart conditions.   Certain cancers.   Hormone Replacement Therapy (HRT) or birth control pills (uncommon but possible).  WHAT IF A BLOOD CLOT HAS ALREADY DEVELOPED? If a blood clot has developed, caregivers can use fondaparinux to treat clots in the legs or lungs. In addition to fondaparinux, another blood thinner called warfarin (Coumadin) will be started 2 to 3 days after fondaparinux has been started. warfarin is a pill and must be taken simultaneously with fondaparinux until the warfarin has begun to work.  Your caregiver may use a blood test called an INR or International Normalization Ratio to know when there is enough warfarin in the blood. Fondaparinux may be stopped when the INR level is between 2.0 to 3.0. This means that your blood is at the necessary and best level to treat the clots that have formed.  OTHER FONDAPARINUX USES In [place], fondaparinux is sometimes used to help when there is not enough blood flow to the heart. HOME CARE INSTRUCTIONS   Fondaparinux should not   be used if you have allergies to the medication, heparin or pork products.   Before giving your medication, make sure the solution is a clear and colorless. If your medication becomes discolored or has particles in the bottle, do not use it. Notify your caregiver right away.   Keep your medication safely stored at room temperatures.   You will be instructed by your caregiver how to give fondaparinux injections.  HOW TO INJECT FONDAPARINUX 1. Fondaparinux is a shot that is given in the stomach (abdomen). Change (rotate) the injection site each time you give yourself a shot.  2. Twist the plunger cap and remove it.  3. Hold the syringe with either hand and use your other hand to twist the rigid needle guard (covers the needle) counter-clockwise. Pull the rigid needle guard straight off the needle. Discard the needle guard.  4. When using the pre-filled syringes, do not expel the air bubble  from the syringe before the injection. The air bubble helps to inject all of the medication when it is given.  5. The injection will be given just underneath the skin into the fat of the belly (subcutaneously). The shots should be injected around the outside of the belly (abdominal wall). Change the place where you give the shot each time. The whole length of the needle should be introduced into a skin fold held between the thumb and forefinger; the skin fold should be held throughout the injection.  6. Inject fondaparinux by pushing the plunger to the bottom of the syringe. Push the plunger rod firmly with your thumb as far as it will go. This will ensure you have injected all of the medication. Do not rub the injection site after completion of the injection. This increases bruising.  7. Remove the syringe from the injection site, keeping your finger on the plunger rod. Be careful not to stick yourself or others.  8. Fondaparinux injection pre-filled syringes and graduated pre-filled syringes are available with a system that shields the needle after injection. After injection and the syringe is empty, set off the safety system by firmly pushing the plunger rod. The protective sleeve will automatically cover the needle and you can hear a click. The click means your needle is safely covered.  9. Get rid of the syringe in the nearest needle box (sharps container).  FONDAPARINUX WARNINGS Problems with fondaparinux are rare. However, side effects and complications can occur, such as:  Serious side effects can include:   If you are taking fondaparinux or other low-molecular-weight heparins and have an epidural or spinal anesthesia or a spinal tap, you are at risk of developing a blood clot in or around the spine. This condition could result in long-term or permanent paralysis.   A condition called heparin-induced thrombocytopenia (HIT) can occur with fondaparinux use. This is where your platelet count drops.  HIT is rare. However, your caregiver will perform lab work to check your blood levels. If you have had this condition before, you should tell your caregiver.   Because fondaparinux thins your blood, complications may include bleeding into the bowel or kidneys.   Mild side effects may include:   Bruising around the injection site.   Mild irritation at the site of injection, such as pain, itching, or redness of skin.  SEEK IMMEDIATE MEDICAL CARE IF:  You develop bleeding problems such as:   Vomiting blood or coughing up blood.   Blood in your urine.   Blood in your stool or your stool has   a dark, tarry, or coffee ground appearance.   You develop any rashes on your skin, such as:   Multiple red "dots."   Long red streaks.   You have large areas of bruising on your skin.   A sudden nosebleed that does not stop after 15 to 20 minutes.   You have any worsening of the condition that led you to fondaparinux treatment.   You develop chest pain or shortness of breath. If the chest pain or shortness of breath is severe, call your local emergency service immediately!  MAKE SURE YOU:   Understand these instructions.   Will watch your condition.   Will get help right away if you are not doing well or get worse.  Document Released: 06/05/2008 Document Revised: 02/26/2011 Document Reviewed: 06/05/2008 [employer] Patient Information 2012 [employer]. 

## 2011-09-04 ENCOUNTER — Ambulatory Visit (HOSPITAL_BASED_OUTPATIENT_CLINIC_OR_DEPARTMENT_OTHER): Payer: Self-pay

## 2011-09-04 VITALS — BP 136/84 | HR 77 | Temp 98.2°F

## 2011-09-04 DIAGNOSIS — Z5111 Encounter for antineoplastic chemotherapy: Secondary | ICD-10-CM

## 2011-09-04 DIAGNOSIS — C6290 Malignant neoplasm of unspecified testis, unspecified whether descended or undescended: Secondary | ICD-10-CM

## 2011-09-04 MED ORDER — PALONOSETRON HCL INJECTION 0.25 MG/5ML
0.2500 mg | Freq: Once | INTRAVENOUS | Status: AC
  Administered 2011-09-04: 0.25 mg via INTRAVENOUS

## 2011-09-04 MED ORDER — DEXTROSE-NACL 5-0.45 % IV SOLN
Freq: Once | INTRAVENOUS | Status: AC
  Administered 2011-09-04: 09:00:00 via INTRAVENOUS
  Filled 2011-09-04: qty 10

## 2011-09-04 MED ORDER — SODIUM CHLORIDE 0.9 % IV SOLN
20.0000 mg/m2 | Freq: Once | INTRAVENOUS | Status: AC
  Administered 2011-09-04: 46 mg via INTRAVENOUS
  Filled 2011-09-04: qty 46

## 2011-09-04 MED ORDER — HEPARIN SOD (PORK) LOCK FLUSH 100 UNIT/ML IV SOLN
500.0000 [IU] | Freq: Once | INTRAVENOUS | Status: AC | PRN
  Administered 2011-09-04: 500 [IU]
  Filled 2011-09-04: qty 5

## 2011-09-04 MED ORDER — SODIUM CHLORIDE 0.9 % IV SOLN
100.0000 mg/m2 | Freq: Once | INTRAVENOUS | Status: AC
  Administered 2011-09-04: 230 mg via INTRAVENOUS
  Filled 2011-09-04: qty 11.5

## 2011-09-04 MED ORDER — SODIUM CHLORIDE 0.9 % IV SOLN
Freq: Once | INTRAVENOUS | Status: AC
  Administered 2011-09-04: 09:00:00 via INTRAVENOUS

## 2011-09-04 MED ORDER — DEXAMETHASONE SODIUM PHOSPHATE 4 MG/ML IJ SOLN
20.0000 mg | Freq: Once | INTRAMUSCULAR | Status: AC
  Administered 2011-09-04: 20 mg via INTRAVENOUS

## 2011-09-04 MED ORDER — SODIUM CHLORIDE 0.9 % IJ SOLN
10.0000 mL | INTRAMUSCULAR | Status: DC | PRN
  Administered 2011-09-04: 10 mL
  Filled 2011-09-04: qty 10

## 2011-09-04 NOTE — Patient Instructions (Addendum)
Etoposide, VP-16 injection What is this medicine? ETOPOSIDE, VP-16 (e toe POE side) is a chemotherapy drug. It is used to treat testicular cancer, lung cancer, and other cancers. This medicine may be used for other purposes; ask your health care provider or pharmacist if you have questions. What should I tell my health care provider before I take this medicine? They need to know if you have any of these conditions: -infection -kidney disease -low blood counts, like low white cell, platelet, or red cell counts -an unusual or allergic reaction to etoposide, other chemotherapeutic agents, other medicines, foods, dyes, or preservatives -pregnant or trying to get pregnant -breast-feeding How should I use this medicine? This medicine is for infusion into a vein. It is administered in a hospital or clinic by a specially trained health care professional. Talk to your pediatrician regarding the use of this medicine in children. Special care may be needed. Overdosage: If you think you have taken too much of this medicine contact a poison control center or emergency room at once. NOTE: This medicine is only for you. Do not share this medicine with others. What if I miss a dose? It is important not to miss your dose. Call your doctor or health care professional if you are unable to keep an appointment. What may interact with this medicine? -cyclosporine -medicines to increase blood counts like filgrastim, pegfilgrastim, sargramostim -vaccines This list may not describe all possible interactions. Give your health care provider a list of all the medicines, herbs, non-prescription drugs, or dietary supplements you use. Also tell them if you smoke, drink alcohol, or use illegal drugs. Some items may interact with your medicine. What should I watch for while using this medicine? Visit your doctor for checks on your progress. This drug may make you feel generally unwell. This is not uncommon, as chemotherapy  can affect healthy cells as well as cancer cells. Report any side effects. Continue your course of treatment even though you feel ill unless your doctor tells you to stop. In some cases, you may be given additional medicines to help with side effects. Follow all directions for their use. Call your doctor or health care professional for advice if you get a fever, chills or sore throat, or other symptoms of a cold or flu. Do not treat yourself. This drug decreases your body's ability to fight infections. Try to avoid being around people who are sick. This medicine may increase your risk to bruise or bleed. Call your doctor or health care professional if you notice any unusual bleeding. Be careful brushing and flossing your teeth or using a toothpick because you may get an infection or bleed more easily. If you have any dental work done, tell your dentist you are receiving this medicine. Avoid taking products that contain aspirin, acetaminophen, ibuprofen, naproxen, or ketoprofen unless instructed by your doctor. These medicines may hide a fever. Do not become pregnant while taking this medicine. Women should inform their doctor if they wish to become pregnant or think they might be pregnant. There is a potential for serious side effects to an unborn child. Talk to your health care professional or pharmacist for more information. Do not breast-feed an infant while taking this medicine. What side effects may I notice from receiving this medicine? Side effects that you should report to your doctor or health care professional as soon as possible: -allergic reactions like skin rash, itching or hives, swelling of the face, lips, or tongue -low blood counts - this medicine  may decrease the number of white blood cells, red blood cells and platelets. You may be at increased risk for infections and bleeding. -signs of infection - fever or chills, cough, sore throat, pain or difficulty passing urine -signs of  decreased platelets or bleeding - bruising, pinpoint red spots on the skin, black, tarry stools, blood in the urine -signs of decreased red blood cells - unusually weak or tired, fainting spells, lightheadedness -breathing problems -changes in vision -mouth or throat sores or ulcers -pain, redness, swelling or irritation at the injection site -pain, tingling, numbness in the hands or feet -redness, blistering, peeling or loosening of the skin, including inside the mouth -seizures -vomiting Side effects that usually do not require medical attention (report to your doctor or health care professional if they continue or are bothersome): -diarrhea -hair loss -loss of appetite -nausea -stomach pain This list may not describe all possible side effects. Call your doctor for medical advice about side effects. You may report side effects to FDA at 1-800-FDA-1088. Where should I keep my medicine? This drug is given in a hospital or clinic and will not be stored at home. NOTE: This sheet is a summary. It may not cover all possible information. If you have questions about this medicine, talk to your doctor, pharmacist, or health care provider.  2012, [employer]. (07/11/2007 5:24:12 PM)Cisplatin injection What is this medicine? CISPLATIN (SIS pla tin) is a chemotherapy drug. It targets fast dividing cells, like cancer cells, and causes these cells to die. This medicine is used to treat many types of cancer like bladder, ovarian, and testicular cancers. This medicine may be used for other purposes; ask your health care provider or pharmacist if you have questions. What should I tell my health care provider before I take this medicine? They need to know if you have any of these conditions: -blood disorders -hearing problems -kidney disease -recent or ongoing radiation therapy -an unusual or allergic reaction to cisplatin, carboplatin, other chemotherapy, other medicines, foods, dyes, or  preservatives -pregnant or trying to get pregnant -breast-feeding How should I use this medicine? This drug is given as an infusion into a vein. It is administered in a hospital or clinic by a specially trained health care professional. Talk to your pediatrician regarding the use of this medicine in children. Special care may be needed. Overdosage: If you think you have taken too much of this medicine contact a poison control center or emergency room at once. NOTE: This medicine is only for you. Do not share this medicine with others. What if I miss a dose? It is important not to miss a dose. Call your doctor or health care professional if you are unable to keep an appointment. What may interact with this medicine? -dofetilide -foscarnet -medicines for seizures -medicines to increase blood counts like filgrastim, pegfilgrastim, sargramostim -probenecid -pyridoxine used with altretamine -rituximab -some antibiotics like amikacin, gentamicin, neomycin, polymyxin B, streptomycin, tobramycin -sulfinpyrazone -vaccines -zalcitabine Talk to your doctor or health care professional before taking any of these medicines: -acetaminophen -aspirin -ibuprofen -ketoprofen -naproxen This list may not describe all possible interactions. Give your health care provider a list of all the medicines, herbs, non-prescription drugs, or dietary supplements you use. Also tell them if you smoke, drink alcohol, or use illegal drugs. Some items may interact with your medicine. What should I watch for while using this medicine? Your condition will be monitored carefully while you are receiving this medicine. You will need important blood work done while  you are taking this medicine. This drug may make you feel generally unwell. This is not uncommon, as chemotherapy can affect healthy cells as well as cancer cells. Report any side effects. Continue your course of treatment even though you feel ill unless your doctor  tells you to stop. In some cases, you may be given additional medicines to help with side effects. Follow all directions for their use. Call your doctor or health care professional for advice if you get a fever, chills or sore throat, or other symptoms of a cold or flu. Do not treat yourself. This drug decreases your body's ability to fight infections. Try to avoid being around people who are sick. This medicine may increase your risk to bruise or bleed. Call your doctor or health care professional if you notice any unusual bleeding. Be careful brushing and flossing your teeth or using a toothpick because you may get an infection or bleed more easily. If you have any dental work done, tell your dentist you are receiving this medicine. Avoid taking products that contain aspirin, acetaminophen, ibuprofen, naproxen, or ketoprofen unless instructed by your doctor. These medicines may hide a fever. Do not become pregnant while taking this medicine. Women should inform their doctor if they wish to become pregnant or think they might be pregnant. There is a potential for serious side effects to an unborn child. Talk to your health care professional or pharmacist for more information. Do not breast-feed an infant while taking this medicine. Drink fluids as directed while you are taking this medicine. This will help protect your kidneys. Call your doctor or health care professional if you get diarrhea. Do not treat yourself. What side effects may I notice from receiving this medicine? Side effects that you should report to your doctor or health care professional as soon as possible: -allergic reactions like skin rash, itching or hives, swelling of the face, lips, or tongue -signs of infection - fever or chills, cough, sore throat, pain or difficulty passing urine -signs of decreased platelets or bleeding - bruising, pinpoint red spots on the skin, black, tarry stools, nosebleeds -signs of decreased red blood  cells - unusually weak or tired, fainting spells, lightheadedness -breathing problems -changes in hearing -gout pain -low blood counts - This drug may decrease the number of white blood cells, red blood cells and platelets. You may be at increased risk for infections and bleeding. -nausea and vomiting -pain, swelling, redness or irritation at the injection site -pain, tingling, numbness in the hands or feet -problems with balance, movement -trouble passing urine or change in the amount of urine Side effects that usually do not require medical attention (report to your doctor or health care professional if they continue or are bothersome): -changes in vision -loss of appetite -metallic taste in the mouth or changes in taste This list may not describe all possible side effects. Call your doctor for medical advice about side effects. You may report side effects to FDA at 1-800-FDA-1088. Where should I keep my medicine? This drug is given in a hospital or clinic and will not be stored at home. NOTE: This sheet is a summary. It may not cover all possible information. If you have questions about this medicine, talk to your doctor, pharmacist, or health care provider.  2012, [employer] Standard. (06/14/2007 2:40:54 PM)      Cisplatin injection What is this medicine? CISPLATIN (SIS pla tin) is a chemotherapy drug. It targets fast dividing cells, like cancer cells, and causes these  cells to die. This medicine is used to treat many types of cancer like bladder, ovarian, and testicular cancers. This medicine may be used for other purposes; ask your health care provider or pharmacist if you have questions. What should I tell my health care provider before I take this medicine? They need to know if you have any of these conditions: -blood disorders -hearing problems -kidney disease -recent or ongoing radiation therapy -an unusual or allergic reaction to cisplatin, carboplatin, other  chemotherapy, other medicines, foods, dyes, or preservatives -pregnant or trying to get pregnant -breast-feeding How should I use this medicine? This drug is given as an infusion into a vein. It is administered in a hospital or clinic by a specially trained health care professional. Talk to your pediatrician regarding the use of this medicine in children. Special care may be needed. Overdosage: If you think you have taken too much of this medicine contact a poison control center or emergency room at once. NOTE: This medicine is only for you. Do not share this medicine with others. What if I miss a dose? It is important not to miss a dose. Call your doctor or health care professional if you are unable to keep an appointment. What may interact with this medicine? -dofetilide -foscarnet -medicines for seizures -medicines to increase blood counts like filgrastim, pegfilgrastim, sargramostim -probenecid -pyridoxine used with altretamine -rituximab -some antibiotics like amikacin, gentamicin, neomycin, polymyxin B, streptomycin, tobramycin -sulfinpyrazone -vaccines -zalcitabine Talk to your doctor or health care professional before taking any of these medicines: -acetaminophen -aspirin -ibuprofen -ketoprofen -naproxen This list may not describe all possible interactions. Give your health care provider a list of all the medicines, herbs, non-prescription drugs, or dietary supplements you use. Also tell them if you smoke, drink alcohol, or use illegal drugs. Some items may interact with your medicine. What should I watch for while using this medicine? Your condition will be monitored carefully while you are receiving this medicine. You will need important blood work done while you are taking this medicine. This drug may make you feel generally unwell. This is not uncommon, as chemotherapy can affect healthy cells as well as cancer cells. Report any side effects. Continue your course of  treatment even though you feel ill unless your doctor tells you to stop. In some cases, you may be given additional medicines to help with side effects. Follow all directions for their use. Call your doctor or health care professional for advice if you get a fever, chills or sore throat, or other symptoms of a cold or flu. Do not treat yourself. This drug decreases your body's ability to fight infections. Try to avoid being around people who are sick. This medicine may increase your risk to bruise or bleed. Call your doctor or health care professional if you notice any unusual bleeding. Be careful brushing and flossing your teeth or using a toothpick because you may get an infection or bleed more easily. If you have any dental work done, tell your dentist you are receiving this medicine. Avoid taking products that contain aspirin, acetaminophen, ibuprofen, naproxen, or ketoprofen unless instructed by your doctor. These medicines may hide a fever. Do not become pregnant while taking this medicine. Women should inform their doctor if they wish to become pregnant or think they might be pregnant. There is a potential for serious side effects to an unborn child. Talk to your health care professional or pharmacist for more information. Do not breast-feed an infant while taking this medicine. Drink  fluids as directed while you are taking this medicine. This will help protect your kidneys. Call your doctor or health care professional if you get diarrhea. Do not treat yourself. What side effects may I notice from receiving this medicine? Side effects that you should report to your doctor or health care professional as soon as possible: -allergic reactions like skin rash, itching or hives, swelling of the face, lips, or tongue -signs of infection - fever or chills, cough, sore throat, pain or difficulty passing urine -signs of decreased platelets or bleeding - bruising, pinpoint red spots on the skin, black,  tarry stools, nosebleeds -signs of decreased red blood cells - unusually weak or tired, fainting spells, lightheadedness -breathing problems -changes in hearing -gout pain -low blood counts - This drug may decrease the number of white blood cells, red blood cells and platelets. You may be at increased risk for infections and bleeding. -nausea and vomiting -pain, swelling, redness or irritation at the injection site -pain, tingling, numbness in the hands or feet -problems with balance, movement -trouble passing urine or change in the amount of urine Side effects that usually do not require medical attention (report to your doctor or health care professional if they continue or are bothersome): -changes in vision -loss of appetite -metallic taste in the mouth or changes in taste This list may not describe all possible side effects. Call your doctor for medical advice about side effects. You may report side effects to FDA at 1-800-FDA-1088. Where should I keep my medicine? This drug is given in a hospital or clinic and will not be stored at home. NOTE: This sheet is a summary. It may not cover all possible information. If you have questions about this medicine, talk to your doctor, pharmacist, or health care provider.  2012, [employer] Standard. (06/14/2007 2:40:54 PM)    Fondaparinux Injections Fondaparinux (Arixtra) injection is a blood thinner (anticoagulant) medication that "thins" the blood and helps to prevent blood clots from developing in your veins. If blood clots develop and are left untreated, they can travel to your heart, lungs, or brain. These clots can cause serious illness and can be fatal. Blood clots can form due to:  Prolonged immobility such as:   People who cannot get out of bed (bedridden).   Sitting for long periods of time, such as long airplane flights.   Surgery, especially operations that involve:   Orthopedic (bones and joints).   The abdomen.    Obesity.   Certain heart conditions.   Certain cancers.   Hormone Replacement Therapy (HRT) or birth control pills (uncommon but possible).  WHAT IF A BLOOD CLOT HAS ALREADY DEVELOPED? If a blood clot has developed, caregivers can use fondaparinux to treat clots in the legs or lungs. In addition to fondaparinux, another blood thinner called warfarin (Coumadin) will be started 2 to 3 days after fondaparinux has been started. warfarin is a pill and must be taken simultaneously with fondaparinux until the warfarin has begun to work.  Your caregiver may use a blood test called an INR or International Normalization Ratio to know when there is enough warfarin in the blood. Fondaparinux may be stopped when the INR level is between 2.0 to 3.0. This means that your blood is at the necessary and best level to treat the clots that have formed.  OTHER FONDAPARINUX USES In [place], fondaparinux is sometimes used to help when there is not enough blood flow to the heart. HOME CARE INSTRUCTIONS   Fondaparinux should not  be used if you have allergies to the medication, heparin or pork products.   Before giving your medication, make sure the solution is a clear and colorless. If your medication becomes discolored or has particles in the bottle, do not use it. Notify your caregiver right away.   Keep your medication safely stored at room temperatures.   You will be instructed by your caregiver how to give fondaparinux injections.  HOW TO INJECT FONDAPARINUX 1. Fondaparinux is a shot that is given in the stomach (abdomen). Change (rotate) the injection site each time you give yourself a shot.  2. Twist the plunger cap and remove it.  3. Hold the syringe with either hand and use your other hand to twist the rigid needle guard (covers the needle) counter-clockwise. Pull the rigid needle guard straight off the needle. Discard the needle guard.  4. When using the pre-filled syringes, do not expel the air bubble  from the syringe before the injection. The air bubble helps to inject all of the medication when it is given.  5. The injection will be given just underneath the skin into the fat of the belly (subcutaneously). The shots should be injected around the outside of the belly (abdominal wall). Change the place where you give the shot each time. The whole length of the needle should be introduced into a skin fold held between the thumb and forefinger; the skin fold should be held throughout the injection.  6. Inject fondaparinux by pushing the plunger to the bottom of the syringe. Push the plunger rod firmly with your thumb as far as it will go. This will ensure you have injected all of the medication. Do not rub the injection site after completion of the injection. This increases bruising.  7. Remove the syringe from the injection site, keeping your finger on the plunger rod. Be careful not to stick yourself or others.  8. Fondaparinux injection pre-filled syringes and graduated pre-filled syringes are available with a system that shields the needle after injection. After injection and the syringe is empty, set off the safety system by firmly pushing the plunger rod. The protective sleeve will automatically cover the needle and you can hear a click. The click means your needle is safely covered.  9. Get rid of the syringe in the nearest needle box (sharps container).  FONDAPARINUX WARNINGS Problems with fondaparinux are rare. However, side effects and complications can occur, such as:  Serious side effects can include:   If you are taking fondaparinux or other low-molecular-weight heparins and have an epidural or spinal anesthesia or a spinal tap, you are at risk of developing a blood clot in or around the spine. This condition could result in long-term or permanent paralysis.   A condition called heparin-induced thrombocytopenia (HIT) can occur with fondaparinux use. This is where your platelet count drops.  HIT is rare. However, your caregiver will perform lab work to check your blood levels. If you have had this condition before, you should tell your caregiver.   Because fondaparinux thins your blood, complications may include bleeding into the bowel or kidneys.   Mild side effects may include:   Bruising around the injection site.   Mild irritation at the site of injection, such as pain, itching, or redness of skin.  SEEK IMMEDIATE MEDICAL CARE IF:  You develop bleeding problems such as:   Vomiting blood or coughing up blood.   Blood in your urine.   Blood in your stool or your stool has  a dark, tarry, or coffee ground appearance.   You develop any rashes on your skin, such as:   Multiple red "dots."   Long red streaks.   You have large areas of bruising on your skin.   A sudden nosebleed that does not stop after 15 to 20 minutes.   You have any worsening of the condition that led you to fondaparinux treatment.   You develop chest pain or shortness of breath. If the chest pain or shortness of breath is severe, call your local emergency service immediately!  MAKE SURE YOU:   Understand these instructions.   Will watch your condition.   Will get help right away if you are not doing well or get worse.  Document Released: 06/05/2008 Document Revised: 02/26/2011 Document Reviewed: 06/05/2008 [facility] Patient Information 2012 [place], [place]   .[facility] Discharge Instructions for Patients Receiving Chemotherapy  Today you received the following chemotherapy agents Cisplatin, Etoposide  To help prevent nausea and vomiting after your treatment, we encourage you to take your nausea medication:  Phenergan 12.5 mg: 1-2 tabs every 6 hours as needed  Ativan 1mg : apply 1 tab under your tongue every 8 hours as needed for moderate to severe nausea or vomiting, sleep, or anxiety  Decadron 4mg :  Take 2 tabs twice daily starting the day after chemo (Day 6 = 06/27/11)  for 3 days. Repeat with each cycle.   Other Medications:  Colace - this is a stool softener. Take 100mg  capsule 2-12 times a day as needed. If you have to take more than 6 capsules of Colace a day call the Cancer Center.  Senna - this is a mild laxative used to treat mild constipation. May take 2 tabs by mouth daily or up to twice a day as needed for mild constipation  Milk of Magnesia - this is a laxative used to treat moderate to severe constipation. May take 2-4 tablespoons every 8 hours as needed. May increase to 8 tablespoons x 1 dose and if no bowel movement call the Cancer Center  Imodium - this is for diarrhea. Take 2 tabs after 1st loose stool and then 1 tab after each loose stool until you go a total of 12 hours without a loose stool. Call Cancer Center if loose stools continue.   EMLA Cream - Apply quarter size application to Port site 1 hour prior to chemotherapy. Do NOT rub in. Cover area with plastic wrap.    If you develop nausea and vomiting that is not controlled by your nausea medication, call the clinic. If it is after clinic hours your family physician or the after hours number for the clinic or go to the Emergency Department.   BELOW ARE SYMPTOMS THAT SHOULD BE REPORTED IMMEDIATELY:  *FEVER GREATER THAN 100.5 F  *CHILLS WITH OR WITHOUT FEVER  NAUSEA AND VOMITING THAT IS NOT CONTROLLED WITH YOUR NAUSEA MEDICATION  *UNUSUAL SHORTNESS OF BREATH  *UNUSUAL BRUISING OR BLEEDING  TENDERNESS IN MOUTH AND THROAT WITH OR WITHOUT PRESENCE OF ULCERS  *URINARY PROBLEMS  *BOWEL PROBLEMS  UNUSUAL RASH Items with * indicate a potential emergency and should be followed up as soon as possible.  One of the nurses will contact you 24 hours after your treatment. Please let the nurse know about any problems that you may have experienced. Feel free to call the clinic you have any questions or concerns. The clinic phone number is [phone].   I have been informed and  understand all the  instructions given to me. I know to contact the clinic, my physician, or go to the Emergency Department if any problems should occur. I do not have any questions at this time, but understand that I may call the clinic during office hours   should I have any questions or need assistance in obtaining follow up care.    __________________________________________  _____________  __________ Signature of Patient or Authorized Representative            Date                   Time    __________________________________________ Nurse's Signature

## 2011-09-15 ENCOUNTER — Encounter: Payer: Self-pay | Admitting: Oncology

## 2011-09-15 ENCOUNTER — Telehealth: Payer: Self-pay | Admitting: Oncology

## 2011-09-28 ENCOUNTER — Telehealth: Payer: Self-pay | Admitting: Hematology & Oncology

## 2011-09-28 NOTE — Telephone Encounter (Signed)
Faxed records to [employer]ability [employer] per received request.

## 2011-10-02 NOTE — Telephone Encounter (Signed)
Requests letter to permit working out at gym. Notified that letter is ready for pick up.

## 2011-10-05 ENCOUNTER — Ambulatory Visit (HOSPITAL_BASED_OUTPATIENT_CLINIC_OR_DEPARTMENT_OTHER)
Admission: RE | Admit: 2011-10-05 | Discharge: 2011-10-05 | Disposition: A | Discharge status: Home / Self Care | Payer: Self-pay | Source: Ambulatory Visit | Attending: Hematology & Oncology | Admitting: Hematology & Oncology

## 2011-10-05 ENCOUNTER — Encounter (HOSPITAL_BASED_OUTPATIENT_CLINIC_OR_DEPARTMENT_OTHER): Payer: Self-pay

## 2011-10-05 DIAGNOSIS — C6290 Malignant neoplasm of unspecified testis, unspecified whether descended or undescended: Secondary | ICD-10-CM

## 2011-10-05 DIAGNOSIS — I2699 Other pulmonary embolism without acute cor pulmonale: Secondary | ICD-10-CM | POA: Insufficient documentation

## 2011-10-05 DIAGNOSIS — Z7901 Long term (current) use of anticoagulants: Secondary | ICD-10-CM | POA: Insufficient documentation

## 2011-10-05 MED ORDER — IOHEXOL 350 MG/ML SOLN
80.0000 mL | Freq: Once | INTRAVENOUS | Status: AC | PRN
  Administered 2011-10-05: 80 mL via INTRAVENOUS

## 2011-10-09 ENCOUNTER — Telehealth: Payer: Self-pay | Admitting: *Deleted

## 2011-10-09 NOTE — Telephone Encounter (Signed)
Message copied by Anselm Jungling on Fri Oct 09, 2011 11:16 AM ------      Message from: Arlan Organ R      Created: Thu Oct 08, 2011  5:33 PM       Call and tell him that there is no evidence of cancer. This is fantastic. Thanks. Cindee Lame

## 2011-10-09 NOTE — Telephone Encounter (Signed)
Called patient on personal cell phone to let him know that his CT scan  Showed no evidence of blood clot or PE and no evidence of cancer

## 2011-10-12 ENCOUNTER — Ambulatory Visit (HOSPITAL_BASED_OUTPATIENT_CLINIC_OR_DEPARTMENT_OTHER): Payer: Self-pay | Admitting: Hematology & Oncology

## 2011-10-12 ENCOUNTER — Ambulatory Visit: Payer: Self-pay

## 2011-10-12 ENCOUNTER — Other Ambulatory Visit (HOSPITAL_BASED_OUTPATIENT_CLINIC_OR_DEPARTMENT_OTHER): Payer: Self-pay | Admitting: Lab

## 2011-10-12 VITALS — BP 137/91 | HR 89 | Temp 97.8°F | Ht 72.0 in | Wt 238.0 lb

## 2011-10-12 DIAGNOSIS — C6290 Malignant neoplasm of unspecified testis, unspecified whether descended or undescended: Secondary | ICD-10-CM

## 2011-10-12 DIAGNOSIS — Z86718 Personal history of other venous thrombosis and embolism: Secondary | ICD-10-CM

## 2011-10-12 DIAGNOSIS — I2699 Other pulmonary embolism without acute cor pulmonale: Secondary | ICD-10-CM

## 2011-10-12 LAB — CBC WITH DIFFERENTIAL (CANCER CENTER ONLY)
BASO%: 0.5 % (ref 0.0–2.0)
EOS%: 1.2 % (ref 0.0–7.0)
HCT: 38.1 % — ABNORMAL LOW (ref 38.7–49.9)
LYMPH#: 2.2 10*3/uL (ref 0.9–3.3)
LYMPH%: 32.8 % (ref 14.0–48.0)
MCHC: 34.9 g/dL (ref 32.0–35.9)
MONO#: 1 10*3/uL — ABNORMAL HIGH (ref 0.1–0.9)
NEUT%: 50.6 % (ref 40.0–80.0)
Platelets: 187 10*3/uL (ref 145–400)
RDW: 15.1 % (ref 11.1–15.7)
WBC: 6.6 10*3/uL (ref 4.0–10.0)

## 2011-10-12 MED ORDER — SODIUM CHLORIDE 0.9 % IJ SOLN
10.0000 mL | INTRAMUSCULAR | Status: DC | PRN
  Administered 2011-10-12: 10 mL via INTRAVENOUS
  Filled 2011-10-12: qty 10

## 2011-10-12 MED ORDER — HEPARIN SOD (PORK) LOCK FLUSH 100 UNIT/ML IV SOLN
500.0000 [IU] | Freq: Once | INTRAVENOUS | Status: AC
  Administered 2011-10-12: 500 [IU] via INTRAVENOUS
  Filled 2011-10-12: qty 5

## 2011-10-12 NOTE — Progress Notes (Signed)
This office note has been dictated.

## 2011-10-12 NOTE — Progress Notes (Signed)
Devin Finley presented for Portacath access and flush. Proper placement of portacath confirmed by CXR. Portacath located in the right chest wall accessed with  H 20 needle. Clean, Dry and Intact Good blood return present. Portacath flushed with 20ml NS and 500U/67ml Heparin per protocol and needle removed intact. Procedure without incident. Patient tolerated procedure well.

## 2011-10-13 NOTE — Progress Notes (Signed)
CC:   Mark C. Vernie Ammons, M.D.  DIAGNOSIS: 1. Recurrent testicular seminoma. 2. Pulmonary embolism.  CURRENT THERAPY: 1. The patient has completed 4 cycles of etoposide/cisplatin in June     2013. 2. Arixtra 10 mg subcu daily.  INTERIM HISTORY:  Mr. Joyce comes in for followup.  We did go ahead and repeat his scans.  The CT scans were done on 10/05/2011.  The scans showed no evidence of recurrent testicular cancer.  There is no suspicious nodules or masses.  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  This was not felt to be significant or changed.  There is no acute findings in his chest.  He had stable 9-mm retroperitoneal/left periaortic lymph nodes.  He feels okay.  He has had no tingling in his hands or feet.  He has had some nail bed changes consistent with his chemotherapy laceration.  He has had no mouth sores.  He has had no bleeding.  He has had no headache.  There is no double vision or blurred vision.  He has not noticed any leg swelling.  Of note, he has been found in the past to have a DVT in the right leg.  His last tumor markers back in May showed alpha-fetoprotein 4.8 and beta HCG is less than 0.5.  PHYSICAL EXAMINATION:  General:  This is a well-developed, well- nourished, African American gentleman in no obvious distress.  Vital signs:  Temperature 97.1, pulse 89, respiratory rate 18, blood pressure 137/91.  Weight is 238.  Head and neck:  Normocephalic, atraumatic skull.  There are no ocular or oral lesions.  There are no palpable cervical or supraclavicular lymph nodes.  Lungs:  Clear bilaterally. Cardiac:  Regular rate and rhythm with a normal S1 and S2.  There are no murmurs, rubs or bruits.  Abdomen:  Soft with good bowel sounds.  There is no palpable abdominal mass.  There is no palpable hepatosplenomegaly. He has a well-healed left inguinal orchiectomy scar.  Extremities: Shows no clubbing, cyanosis or edema.   Neurological:  Shows no focal neurological deficits.  Skin:  No rashes, ecchymoses or petechia.  LABORATORY STUDIES:  White cell count is 6.6, hemoglobin 13.3, hematocrit 38.1, platelet count 187.  IMPRESSION:  Mr. Haag is a nice 31 year old gentleman with history of recurrent testicular seminoma.  He completed 4 cycles of systemic chemotherapy with the platinum/etoposide in June.  He was found to have a pulmonary embolism that was totally asymptomatic when we did his followup scans, I think back in April.  I believe that he is going to need 6 months of therapeutic anticoagulation.  I feel more confident with Arixtra than I do with Coumadin or one of the newer oral agents.  We will plan for a followup scan on him in 3 months.  We will have his Port-A-Cath flushed in 6 weeks.  I will see him back in 3 months.    ______________________________ Josph Macho, M.D. PRE/MEDQ  D:  10/12/2011  T:  10/13/2011  Job:  [id number]

## 2011-10-14 ENCOUNTER — Telehealth: Payer: Self-pay | Admitting: *Deleted

## 2011-10-14 NOTE — Telephone Encounter (Signed)
Called patient to let him know that his tumor markers are normal per dr. Myna Hidalgo

## 2011-10-14 NOTE — Telephone Encounter (Signed)
Message copied by Anselm Jungling on Wed Oct 14, 2011  9:49 AM ------      Message from: Arlan Organ R      Created: Wed Oct 14, 2011  7:38 AM       Call and tell him that the tumor markers for his cancer for his cancer are normal.  Cindee Lame

## 2011-10-20 LAB — BETA HCG QUANT (REF LAB): Beta hCG, Tumor Marker: <0.5 m[IU]/mL (ref <5.0)

## 2011-10-20 LAB — COMPREHENSIVE METABOLIC PANEL
AST: 27 U/L (ref 0–37)
BUN: 17 mg/dL (ref 6–23)
Calcium: 9.2 mg/dL (ref 8.4–10.5)
Chloride: 106 mEq/L (ref 96–112)
Creatinine, Ser: 1.08 mg/dL (ref 0.50–1.35)
Total Bilirubin: 0.3 mg/dL (ref 0.3–1.2)

## 2011-11-24 ENCOUNTER — Ambulatory Visit: Payer: Self-pay

## 2011-11-24 NOTE — Progress Notes (Signed)
Pt did not came today for port flush.

## 2012-01-04 ENCOUNTER — Other Ambulatory Visit: Payer: Self-pay | Admitting: Lab

## 2012-01-07 ENCOUNTER — Ambulatory Visit (HOSPITAL_BASED_OUTPATIENT_CLINIC_OR_DEPARTMENT_OTHER)
Admission: RE | Admit: 2012-01-07 | Discharge: 2012-01-07 | Disposition: A | Discharge status: Home / Self Care | Payer: Self-pay | Source: Ambulatory Visit | Attending: Hematology & Oncology | Admitting: Hematology & Oncology

## 2012-01-07 ENCOUNTER — Other Ambulatory Visit (HOSPITAL_BASED_OUTPATIENT_CLINIC_OR_DEPARTMENT_OTHER): Payer: Self-pay | Admitting: Lab

## 2012-01-07 DIAGNOSIS — I2699 Other pulmonary embolism without acute cor pulmonale: Secondary | ICD-10-CM

## 2012-01-07 DIAGNOSIS — C6290 Malignant neoplasm of unspecified testis, unspecified whether descended or undescended: Secondary | ICD-10-CM

## 2012-01-07 DIAGNOSIS — Z09 Encounter for follow-up examination after completed treatment for conditions other than malignant neoplasm: Secondary | ICD-10-CM | POA: Insufficient documentation

## 2012-01-07 LAB — CBC WITH DIFFERENTIAL (CANCER CENTER ONLY)
Eosinophils Absolute: 0.1 10*3/uL (ref 0.0–0.5)
HCT: 47.8 % (ref 38.7–49.9)
LYMPH%: 44.5 % (ref 14.0–48.0)
MCH: 30.7 pg (ref 28.0–33.4)
MCV: 90 fL (ref 82–98)
MONO#: 0.7 10*3/uL (ref 0.1–0.9)
MONO%: 11.9 % (ref 0.0–13.0)
NEUT%: 41.6 % (ref 40.0–80.0)
RBC: 5.34 10*6/uL (ref 4.20–5.70)
WBC: 5.6 10*3/uL (ref 4.0–10.0)

## 2012-01-07 LAB — CMP (CANCER CENTER ONLY)
ALT(SGPT): 47 U/L (ref 10–47)
Albumin: 3.9 g/dL (ref 3.3–5.5)
CO2: 26 mEq/L (ref 18–33)
Chloride: 104 mEq/L (ref 98–108)
Sodium: 139 mEq/L (ref 128–145)
Total Protein: 7.7 g/dL (ref 6.4–8.1)

## 2012-01-07 MED ORDER — IOHEXOL 350 MG/ML SOLN
100.0000 mL | Freq: Once | INTRAVENOUS | Status: AC | PRN
  Administered 2012-01-07: 100 mL via INTRAVENOUS

## 2012-01-08 ENCOUNTER — Ambulatory Visit (HOSPITAL_BASED_OUTPATIENT_CLINIC_OR_DEPARTMENT_OTHER): Payer: Self-pay | Admitting: Hematology & Oncology

## 2012-01-08 ENCOUNTER — Ambulatory Visit: Payer: Self-pay

## 2012-01-08 VITALS — BP 124/74 | HR 73 | Temp 98.6°F | Resp 20 | Ht 73.0 in | Wt 242.0 lb

## 2012-01-08 DIAGNOSIS — C6290 Malignant neoplasm of unspecified testis, unspecified whether descended or undescended: Secondary | ICD-10-CM

## 2012-01-08 DIAGNOSIS — I2699 Other pulmonary embolism without acute cor pulmonale: Secondary | ICD-10-CM

## 2012-01-08 MED ORDER — HEPARIN SOD (PORK) LOCK FLUSH 100 UNIT/ML IV SOLN
500.0000 [IU] | Freq: Once | INTRAVENOUS | Status: AC
  Administered 2012-01-08: 500 [IU] via INTRAVENOUS
  Filled 2012-01-08: qty 5

## 2012-01-08 MED ORDER — SODIUM CHLORIDE 0.9 % IJ SOLN
10.0000 mL | INTRAMUSCULAR | Status: DC | PRN
  Administered 2012-01-08: 10 mL via INTRAVENOUS
  Filled 2012-01-08: qty 10

## 2012-01-08 NOTE — Progress Notes (Signed)
CC:   Mark C. Vernie Ammons, M.D.  DIAGNOSIS: 1. Recurrent testicular seminoma. 2. Pulmonary embolism.  CURRENT THERAPY:  Arixtra 10 mg subcu daily.  Patient to complete this in December.  INTERIM HISTORY:  Mr. Devin Finley comes in for his followup.  He is doing great.  He really looks good.  He has had no problem with cough or shortness of breath.  There has been no abdominal pain.  He has had no fevers, sweats or chills.  He is working out and becoming more active.  He does have the pulmonary embolism.  He also was found to have a DVT in the right leg.  We did go ahead and do his scans.  These were just done a couple of days ago.  The CT angio of the chest did not show any residual pulmonary embolism.  There is no evidence of metastatic disease to the pulmonary parenchyma or pathologic hilar lymph nodes.  CT of the abdomen and pelvis did not show any evidence of recurrent disease.  There is some small retroperitoneal nodes, again which were unchanged.  He is having no problems going to the bathroom.  There has been no leg swelling.  He has had no rashes.  He has had no fevers, sweats or chills.  PHYSICAL EXAMINATION:  This is a very muscular African American gentleman in no obvious distress.  Vital signs: 98.6, pulse 78, respiratory rate 20, blood pressure 124/73.  Weight is 242.  Head and neck:  Normocephalic, atraumatic skull.  There are no ocular or oral lesions.  There are no palpable cervical or supraclavicular lymph nodes. Lungs:  Clear bilaterally.  Cardiac:  Regular rate and rhythm with a normal S1, S2.  There are no murmurs, rubs or bruits.  Abdomen:  Soft with good bowel sounds.  There is no palpable abdominal mass.  He has the left inguinal orchiectomy that is well healed.  There is no palpable hepatosplenomegaly.  Extremities:  No clubbing, cyanosis or edema.  No venous cords are noted in his legs.  Back:  No tenderness of the spine, ribs, or hips.  LABORATORY STUDIES:   White cell count is 5.6, hemoglobin 16.4, hematocrit 47.8, platelet count 156.  LDH is 234. His alpha fetoprotein is 4.1.  D-dimer 0.27.  IMPRESSION:  Devin Finley is a 31 year old African American gentleman with recurrent testicular seminoma.  He received 4 cycles of chemotherapy with etoposide/cisplatin.  He completed this in June of 2013.  I suspect that he should be cured.  With the salvage chemotherapy, the cure rates are still well over 90%.  I am glad that there is no evidence of a pulmonary emboli.  We will keep him on the Arixtra for now, and he will complete this in December.  We do not need to do any scans on him until next year in January.  He does have a Port-A-Cath.  This will continue to get flushed every 6 weeks.    ______________________________ Josph Macho, M.D. PRE/MEDQ  D:  01/08/2012  T:  01/08/2012  Job:  [id number]

## 2012-01-08 NOTE — Progress Notes (Signed)
This office note has been dictated.

## 2012-01-10 LAB — BETA HCG QUANT (REF LAB): Beta hCG, Tumor Marker: <0.5 m[IU]/mL (ref <5.0)

## 2012-01-11 ENCOUNTER — Ambulatory Visit: Payer: Self-pay | Admitting: Hematology & Oncology

## 2012-01-11 ENCOUNTER — Other Ambulatory Visit: Payer: Self-pay | Admitting: Lab

## 2012-02-26 ENCOUNTER — Ambulatory Visit (HOSPITAL_BASED_OUTPATIENT_CLINIC_OR_DEPARTMENT_OTHER): Payer: Self-pay

## 2012-02-26 VITALS — BP 130/70 | HR 74 | Temp 97.3°F | Resp 18

## 2012-02-26 DIAGNOSIS — C6290 Malignant neoplasm of unspecified testis, unspecified whether descended or undescended: Secondary | ICD-10-CM

## 2012-02-26 DIAGNOSIS — Z452 Encounter for adjustment and management of vascular access device: Secondary | ICD-10-CM

## 2012-02-26 MED ORDER — SODIUM CHLORIDE 0.9 % IJ SOLN
10.0000 mL | INTRAMUSCULAR | Status: DC | PRN
  Administered 2012-02-26: 10 mL via INTRAVENOUS
  Filled 2012-02-26: qty 10

## 2012-02-26 MED ORDER — HEPARIN SOD (PORK) LOCK FLUSH 100 UNIT/ML IV SOLN
500.0000 [IU] | Freq: Once | INTRAVENOUS | Status: AC
  Administered 2012-02-26: 500 [IU] via INTRAVENOUS
  Filled 2012-02-26: qty 5

## 2012-02-26 NOTE — Progress Notes (Signed)
Devin Finley presented for Portacath access and flush. Proper placement of portacath confirmed by CXR. Portacath located in the right chest wall accessed with  H 20 needle. Clean, Dry and Intact Good blood return present. Portacath flushed with 20ml NS and 500U/58ml Heparin per protocol and needle removed intact. Procedure without incident. Patient tolerated procedure well.

## 2012-02-26 NOTE — Patient Instructions (Signed)

## 2012-04-04 ENCOUNTER — Ambulatory Visit (HOSPITAL_BASED_OUTPATIENT_CLINIC_OR_DEPARTMENT_OTHER): Admission: RE | Admit: 2012-04-04 | Payer: Self-pay | Source: Ambulatory Visit

## 2012-04-08 ENCOUNTER — Telehealth: Payer: Self-pay | Admitting: Hematology & Oncology

## 2012-04-08 NOTE — Telephone Encounter (Signed)
Patient called and cancelled 04/11/12 appointment and rescheduled for 04/14/12.

## 2012-04-11 ENCOUNTER — Other Ambulatory Visit: Payer: Self-pay | Admitting: Lab

## 2012-04-11 ENCOUNTER — Ambulatory Visit: Payer: Self-pay | Admitting: Hematology & Oncology

## 2012-04-11 ENCOUNTER — Ambulatory Visit (HOSPITAL_BASED_OUTPATIENT_CLINIC_OR_DEPARTMENT_OTHER)
Admission: RE | Admit: 2012-04-11 | Discharge: 2012-04-11 | Disposition: A | Discharge status: Home / Self Care | Payer: Self-pay | Source: Ambulatory Visit | Attending: Hematology & Oncology | Admitting: Hematology & Oncology

## 2012-04-11 ENCOUNTER — Encounter (HOSPITAL_BASED_OUTPATIENT_CLINIC_OR_DEPARTMENT_OTHER): Payer: Self-pay

## 2012-04-11 DIAGNOSIS — I2699 Other pulmonary embolism without acute cor pulmonale: Secondary | ICD-10-CM

## 2012-04-11 DIAGNOSIS — C6290 Malignant neoplasm of unspecified testis, unspecified whether descended or undescended: Secondary | ICD-10-CM | POA: Insufficient documentation

## 2012-04-11 MED ORDER — IOHEXOL 300 MG/ML  SOLN
100.0000 mL | Freq: Once | INTRAMUSCULAR | Status: AC | PRN
  Administered 2012-04-11: 100 mL via INTRAVENOUS

## 2012-04-14 ENCOUNTER — Other Ambulatory Visit (HOSPITAL_BASED_OUTPATIENT_CLINIC_OR_DEPARTMENT_OTHER): Payer: Self-pay | Admitting: Lab

## 2012-04-14 ENCOUNTER — Ambulatory Visit (HOSPITAL_BASED_OUTPATIENT_CLINIC_OR_DEPARTMENT_OTHER): Payer: Self-pay | Admitting: Hematology & Oncology

## 2012-04-14 VITALS — BP 134/78 | HR 74 | Temp 97.7°F | Resp 18 | Ht 72.0 in | Wt 246.0 lb

## 2012-04-14 DIAGNOSIS — I2699 Other pulmonary embolism without acute cor pulmonale: Secondary | ICD-10-CM

## 2012-04-14 DIAGNOSIS — C6290 Malignant neoplasm of unspecified testis, unspecified whether descended or undescended: Secondary | ICD-10-CM

## 2012-04-14 DIAGNOSIS — Z8547 Personal history of malignant neoplasm of testis: Secondary | ICD-10-CM

## 2012-04-14 LAB — CBC WITH DIFFERENTIAL (CANCER CENTER ONLY)
BASO#: 0 10*3/uL (ref 0.0–0.2)
EOS%: 2.2 % (ref 0.0–7.0)
HGB: 15.9 g/dL (ref 13.0–17.1)
MCH: 30.2 pg (ref 28.0–33.4)
MCHC: 34.9 g/dL (ref 32.0–35.9)
MONO%: 7.9 % (ref 0.0–13.0)
NEUT#: 2.2 10*3/uL (ref 1.5–6.5)
NEUT%: 40.9 % (ref 40.0–80.0)

## 2012-04-14 NOTE — Progress Notes (Signed)
This office note has been dictated.

## 2012-04-15 NOTE — Progress Notes (Signed)
CC:   Mark C. Vernie Ammons, M.D.  DIAGNOSES: 1. Recurrent testicular seminoma. 2. Pulmonary embolism.  CURRENT THERAPY:  Observation.  INTERIM HISTORY:  Mr. Harpster comes in for followup.  He is doing well. He is working at [employer].  He is enjoying this.  He does plan to go back to school this fall to complete his degree.  He completed his 1 year of Arixtra back in December.  He had a pulmonary embolism.  He had a DVT in the right leg.  I told him to start taking 2 baby aspirin a day.  We did do his scans.  They were done a couple weeks ago.  The scans did not show any evidence of recurrent seminoma.  His tumor markers have all been okay.  His last tumor markers that we checked back in October showed off alpha fetoprotein of 4.1 and beta hCG less than 0.5.  He has had no cough.  He has had no nausea or vomiting.  He has had no shortness of breath.  There has been no change in bowel or bladder habits.  PHYSICAL EXAMINATION:  General:  This is a well-developed, well- nourished black gentleman in no obvious distress.  Vital signs: Temperature of 97.7, pulse 74, respiratory rate 18, blood pressure 134/78.  Weight is 246.  Head and neck:  Normocephalic, atraumatic skull.  There are no ocular or oral lesions.  There are no palpable cervical or supraclavicular lymph nodes.  Lungs:  Clear to percussion and auscultation bilaterally.  Cardiac:  Regular rate and rhythm with a normal S1 and S2.  There are no murmurs, rubs, or bruits.  Abdomen: Soft with good bowel sounds.  There is no fluid wave.  There is no palpable abdominal mass.  No palpable hepatosplenomegaly.  He has a well- healed left inguinal orchiectomy scar.  Back:  No tenderness over the spine, ribs, or hips.  Extremities:  No clubbing, cyanosis, or edema. Skin:  No rashes, ecchymosis, or petechia.  Neurological:  No focal neurological deficits.  LABORATORY STUDIES:  Show white cell count of 5.5, hemoglobin  15.9, hematocrit 45.6, platelet count 159.  IMPRESSION:  Mr. Bhatnagar is a 32 year old gentleman with a history of recurrent seminoma of the testicle.  He underwent chemotherapy with cisplatin/etoposide.  He had 4 cycles.  This was completed back in June 2013.  Again, I do not see any evidence of recurrent disease.  We will go ahead and plan for a followup CT scan in 4 months now.  He still has a Port-A-Cath in.  We will keep this in for now.  Again, I did tell him about aspirin.  Two baby aspirin a day I think would be appropriate for him.  I will see him back in May.    ______________________________ Josph Macho, M.D. PRE/MEDQ  D:  04/14/2012  T:  04/15/2012  Job:  [id number]

## 2012-04-17 LAB — LACTATE DEHYDROGENASE: LDH: 205 U/L (ref 94–250)

## 2012-04-17 LAB — COMPREHENSIVE METABOLIC PANEL
AST: 32 U/L (ref 0–37)
Alkaline Phosphatase: 107 U/L (ref 39–117)
BUN: 21 mg/dL (ref 6–23)
Creatinine, Ser: 1.12 mg/dL (ref 0.50–1.35)
Total Bilirubin: 0.3 mg/dL (ref 0.3–1.2)

## 2012-04-17 LAB — BETA HCG QUANT (REF LAB): Beta hCG, Tumor Marker: <0.5 m[IU]/mL (ref <5.0)

## 2012-04-18 ENCOUNTER — Telehealth: Payer: Self-pay

## 2012-04-18 NOTE — Telephone Encounter (Addendum)
Message copied by Leana Gamer on Mon Apr 18, 2012  3:26 PM ------      Message from: Josph Macho      Created: Sun Apr 17, 2012  4:38 PM       Call - labs look great!!! Cindee Lame  Patient notified regarding his labs looking good. Pt verbalized understanding.

## 2012-05-27 IMAGING — CT CT CHEST W/ CM
2 of 3 series · 14 of 46 positions shown, 16 images · IV contrast (APPLIED)
Comparison: 05/21/2011

CT CHEST

CLINICAL DATA: Status post chemotherapy for testicular cancer.
Chronic left-sided pain.  Surgery 05/21/2010.  Began chemotherapy
in [place] of this year.

CT CHEST, ABDOMEN AND PELVIS WITH CONTRAST
TECHNIQUE: Contiguous axial images of the chest abdomen and pelvis
were obtained after IV contrast administration.
Contrast: 100  ml [id number]

[Series 2: chest/abd/pel 5.0 b31f · axial · 0.72mm/px · z∈[-514,+96]mm · 11 of 140 slices shown, 13 images]
[im 9/140  soft-tissue]
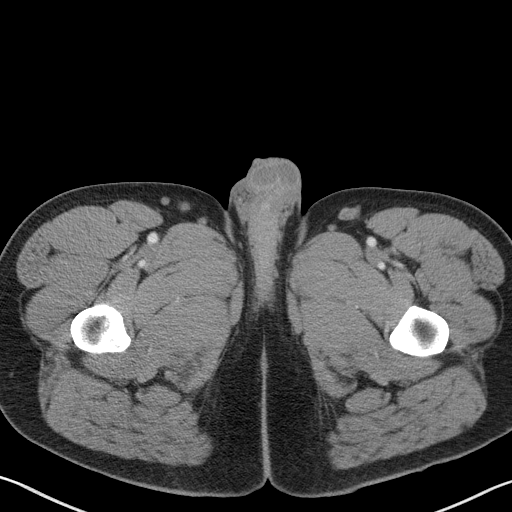
[im 9/140  bone]
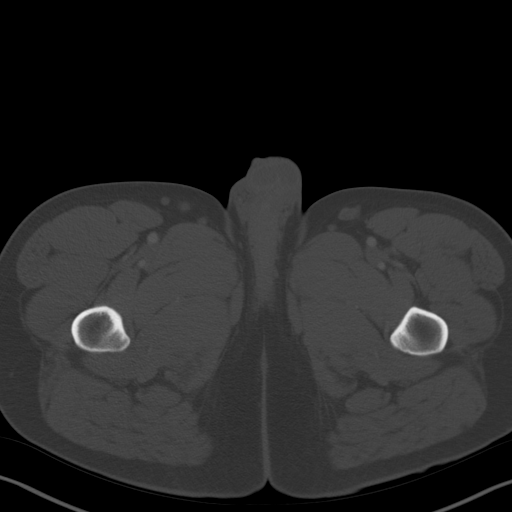
[im 23/140  soft-tissue]
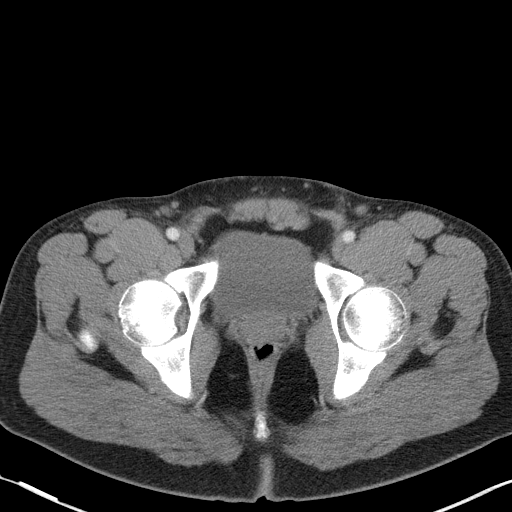
[im 32/140  soft-tissue]
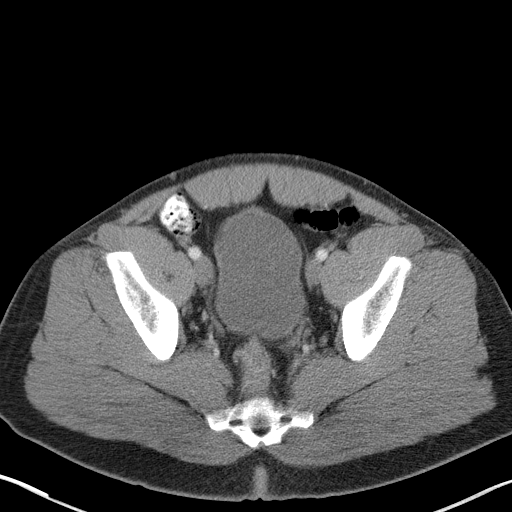
[im 45/140  soft-tissue]
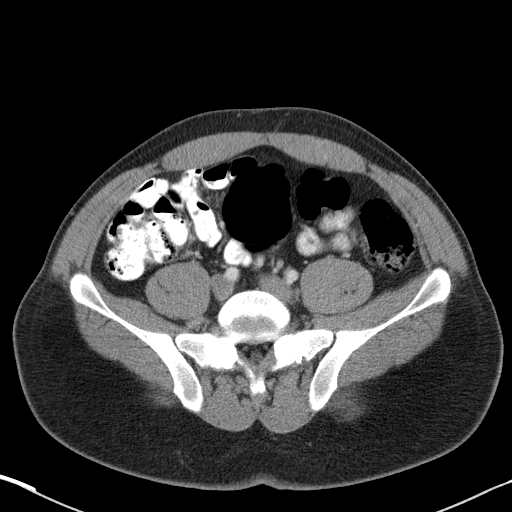
[im 59/140  soft-tissue]
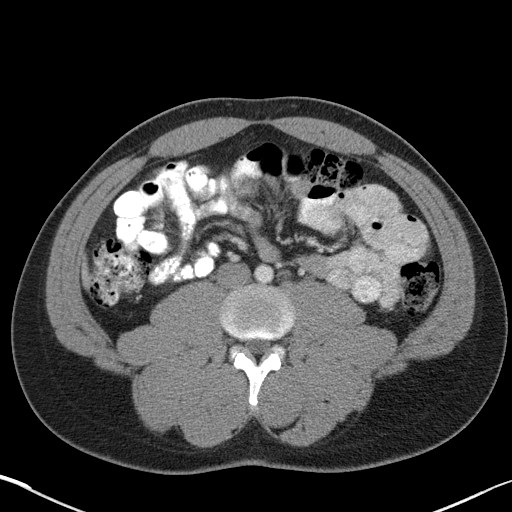
[im 72/140  soft-tissue]
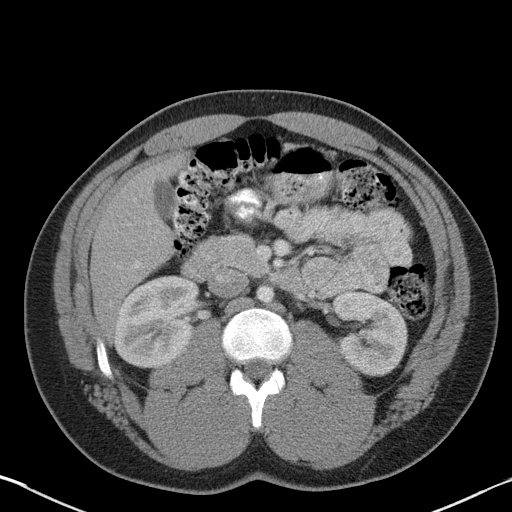
[im 81/140  soft-tissue]
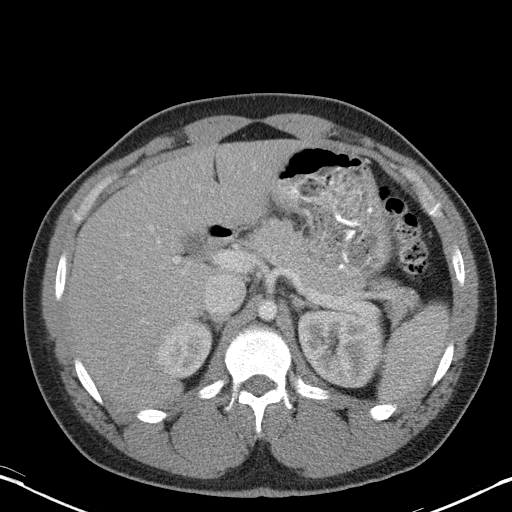
[im 95/140  soft-tissue]
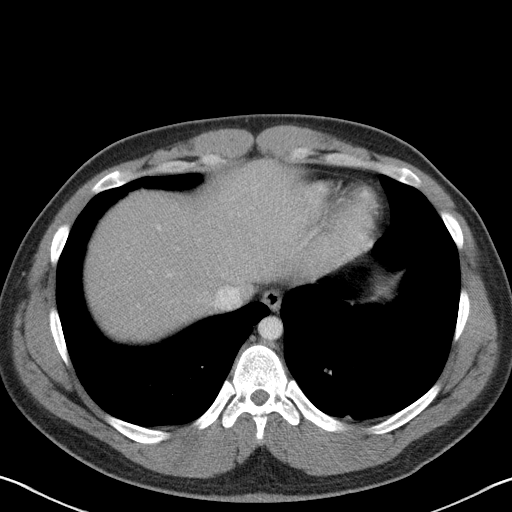
[im 108/140  soft-tissue]
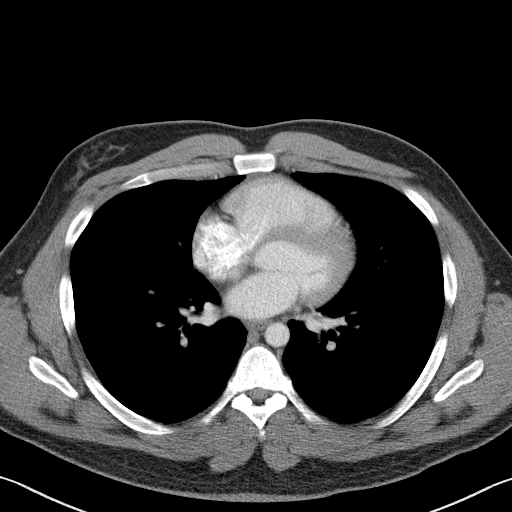
[im 108/140  bone]
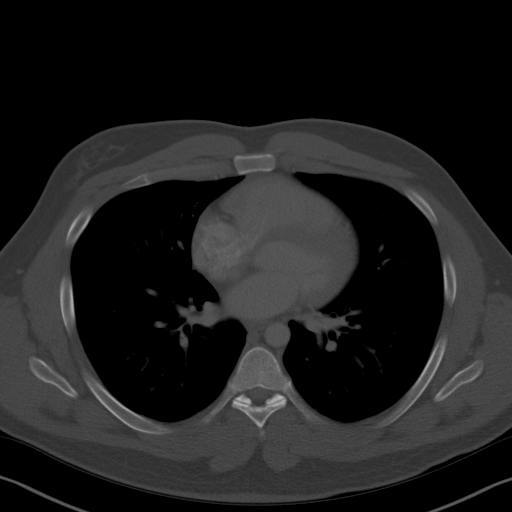
[im 117/140  soft-tissue]
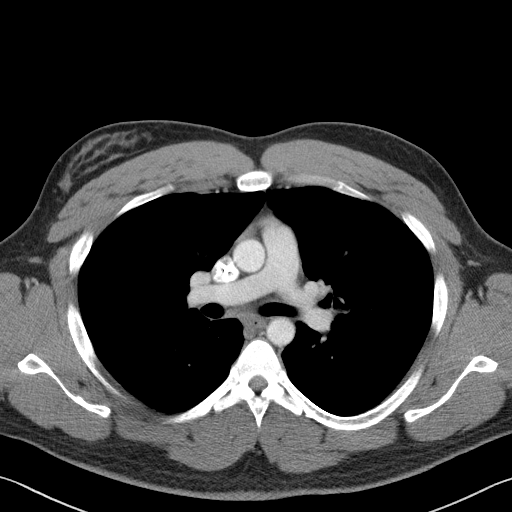
[im 131/140  soft-tissue]
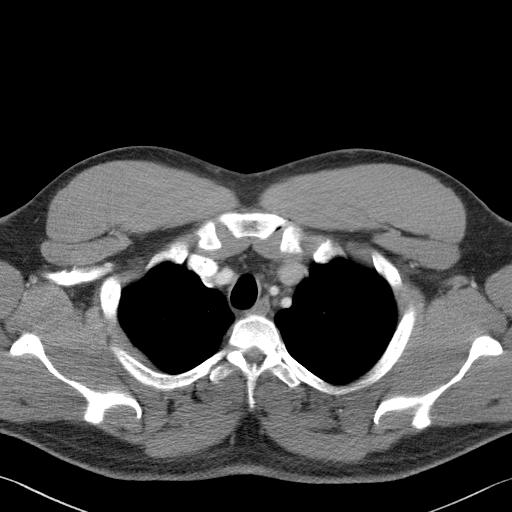

[Series 5: chest/abd/pel 3.0 coronal · coronal · 0.71mm/px · 3 of 97 slices shown]
[im 33/97  soft-tissue]
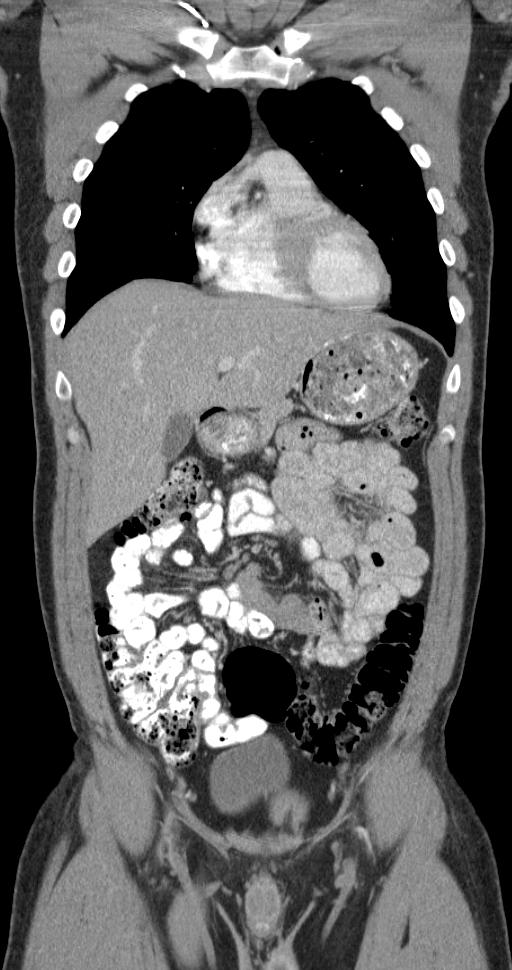
[im 43/97  soft-tissue]
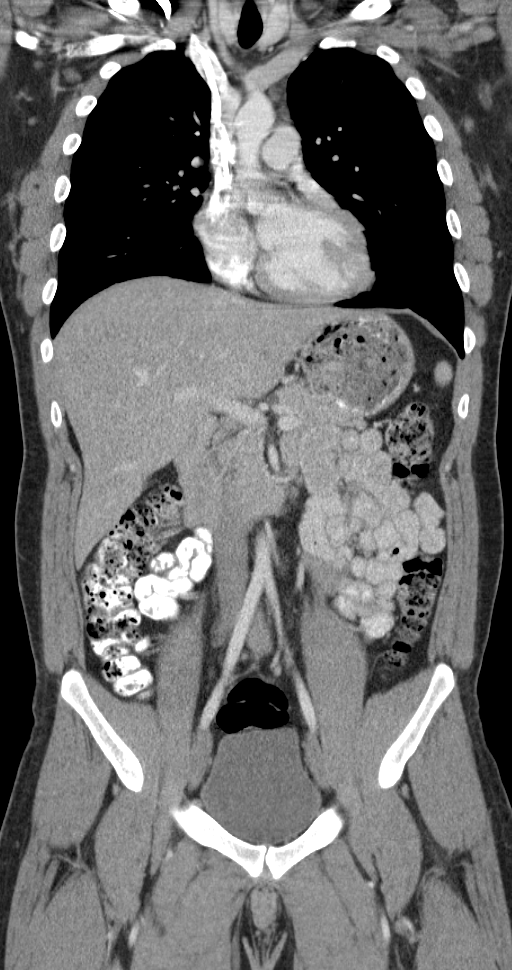
[im 54/97  soft-tissue]
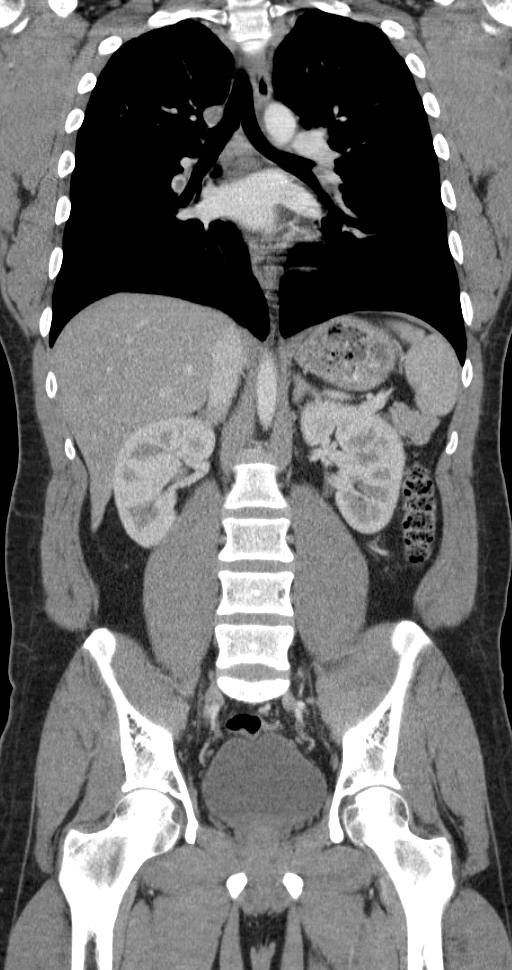

[14 of 46 positions shown; findings below may reference images not displayed]

FINDINGS: Lung windows demonstrate minimal subpleural nodularity in
the right upper lobe on image 18, unchanged.  Minimal scarring or
atelectasis at the right lung base, unchanged.

Soft tissue windows demonstrate no supraclavicular adenopathy.  A
right-sided Port-A-Cath terminates at the superior caval/atrial
junction.  There is right greater than left bilateral gynecomastia.
Normal heart size without pericardial or pleural effusion.  Filling
defect within right lower lobe pulmonary arterial tree.  Example
images 28 - 30 of series 2. Confirmed on sagittal image 46 and
coronal image 55.  New.

No mediastinal or hilar adenopathy.
IMPRESSION: 1.  No  evidence of metastatic disease in the chest.
2.  Right-sided pulmonary emboli on this non dedicated study. These
results were called by telephone on 07/30/2011  at  [DATE] p.m. to
Lo, Kam., who verbally acknowledged these results.
3.  Right greater than left gynecomastia, as before.

CT ABDOMEN AND PELVIS
FINDINGS: Hepatomegaly, 20 cm.  Suspect mild hepatic steatosis.
No focal liver lesion.  Normal spleen, stomach, pancreas,
gallbladder, biliary tract, adrenal glands, kidneys.

Improvement to resolution of retroperitoneal adenopathy.
- A retroperitoneal nodes just inferior to the left renal vein in
the left para-aortic station measures 8 mm today versus 2.3 x
cm on the prior.
- Immediately inferior left para-aortic node measures 1.0 cm on
image 80 versus 1.1 cm on the prior.

The adjacent mesenteric node is resolved.  No retrocrural
adenopathy.

Normal colon, appendix, and terminal ileum.  Normal small bowel
without abdominal ascites.

Inguinal nodes are stable and not pathologic by size criteria. No
pelvic adenopathy.    Normal urinary bladder and prostate.  No
significant free fluid.  Left-sided testicular prosthesis is
incompletely imaged.  Similar probable bone island in the right
femoral neck.  Similar sclerotic focus within the right iliac bone.
IMPRESSION: 1.  Response to therapy of retroperitoneal and posterior mesenteric
adenopathy.  The largest left para-aortic node is now borderline
pathologic by size criteria.
2.  No new or progressive disease.
3.  Hepatomegaly and suspicion of mild hepatic steatosis.

## 2012-06-08 ENCOUNTER — Telehealth: Payer: Self-pay | Admitting: Hematology & Oncology

## 2012-06-08 NOTE — Telephone Encounter (Signed)
Patient called and cx 06/09/12 apt and resch for 06/16/12

## 2012-06-16 ENCOUNTER — Ambulatory Visit: Payer: Self-pay

## 2012-06-17 ENCOUNTER — Ambulatory Visit (HOSPITAL_BASED_OUTPATIENT_CLINIC_OR_DEPARTMENT_OTHER): Payer: Self-pay

## 2012-06-17 DIAGNOSIS — C6290 Malignant neoplasm of unspecified testis, unspecified whether descended or undescended: Secondary | ICD-10-CM

## 2012-06-17 MED ORDER — SODIUM CHLORIDE 0.9 % IJ SOLN
10.0000 mL | INTRAMUSCULAR | Status: DC | PRN
  Administered 2012-06-17: 10 mL via INTRAVENOUS
  Filled 2012-06-17: qty 10

## 2012-06-17 MED ORDER — HEPARIN SOD (PORK) LOCK FLUSH 100 UNIT/ML IV SOLN
500.0000 [IU] | Freq: Once | INTRAVENOUS | Status: AC
  Administered 2012-06-17: 500 [IU] via INTRAVENOUS
  Filled 2012-06-17: qty 5

## 2012-06-17 NOTE — Patient Instructions (Signed)

## 2012-07-27 ENCOUNTER — Ambulatory Visit (HOSPITAL_BASED_OUTPATIENT_CLINIC_OR_DEPARTMENT_OTHER)
Admission: RE | Admit: 2012-07-27 | Discharge: 2012-07-27 | Disposition: A | Discharge status: Home / Self Care | Payer: Self-pay | Source: Ambulatory Visit | Attending: Hematology & Oncology | Admitting: Hematology & Oncology

## 2012-07-27 ENCOUNTER — Encounter (HOSPITAL_BASED_OUTPATIENT_CLINIC_OR_DEPARTMENT_OTHER): Payer: Self-pay

## 2012-07-27 ENCOUNTER — Ambulatory Visit (HOSPITAL_BASED_OUTPATIENT_CLINIC_OR_DEPARTMENT_OTHER): Payer: Self-pay

## 2012-07-27 DIAGNOSIS — Z8547 Personal history of malignant neoplasm of testis: Secondary | ICD-10-CM

## 2012-07-27 DIAGNOSIS — Z452 Encounter for adjustment and management of vascular access device: Secondary | ICD-10-CM

## 2012-07-27 DIAGNOSIS — N62 Hypertrophy of breast: Secondary | ICD-10-CM | POA: Insufficient documentation

## 2012-07-27 DIAGNOSIS — C6290 Malignant neoplasm of unspecified testis, unspecified whether descended or undescended: Secondary | ICD-10-CM | POA: Insufficient documentation

## 2012-07-27 MED ORDER — SODIUM CHLORIDE 0.9 % IJ SOLN
10.0000 mL | INTRAMUSCULAR | Status: DC | PRN
  Administered 2012-07-27: 10 mL via INTRAVENOUS
  Filled 2012-07-27: qty 10

## 2012-07-27 MED ORDER — IOHEXOL 300 MG/ML  SOLN
100.0000 mL | Freq: Once | INTRAMUSCULAR | Status: AC | PRN
  Administered 2012-07-27: 100 mL via INTRAVENOUS

## 2012-07-27 MED ORDER — HEPARIN SOD (PORK) LOCK FLUSH 100 UNIT/ML IV SOLN
500.0000 [IU] | Freq: Once | INTRAVENOUS | Status: AC
  Administered 2012-07-27: 500 [IU] via INTRAVENOUS
  Filled 2012-07-27: qty 5

## 2012-07-27 NOTE — Patient Instructions (Addendum)

## 2012-07-28 ENCOUNTER — Telehealth: Payer: Self-pay | Admitting: *Deleted

## 2012-07-28 NOTE — Telephone Encounter (Signed)
Message copied by Anselm Jungling on Thu Jul 28, 2012 10:19 AM ------      Message from: Arlan Organ R      Created: Wed Jul 27, 2012  1:08 PM       Call - No cancer seen on CT scan!!  Yeah!! Have a great summer!! Pete ------

## 2012-07-28 NOTE — Telephone Encounter (Signed)
Called patient to let him know that his CT scan was negative for cancer.  Left message on patients personal cell phone

## 2012-08-11 ENCOUNTER — Ambulatory Visit: Payer: Self-pay | Admitting: Hematology & Oncology

## 2012-08-11 ENCOUNTER — Other Ambulatory Visit: Payer: Self-pay | Admitting: Lab

## 2012-08-12 ENCOUNTER — Telehealth: Payer: Self-pay | Admitting: Hematology & Oncology

## 2012-08-12 NOTE — Telephone Encounter (Signed)
Left pt message to call and reschedule missed appointment from 5-22.  °

## 2012-09-14 ENCOUNTER — Other Ambulatory Visit: Payer: Self-pay | Admitting: *Deleted

## 2012-09-14 DIAGNOSIS — C6290 Malignant neoplasm of unspecified testis, unspecified whether descended or undescended: Secondary | ICD-10-CM

## 2012-09-14 DIAGNOSIS — Z9221 Personal history of antineoplastic chemotherapy: Secondary | ICD-10-CM

## 2012-09-15 ENCOUNTER — Other Ambulatory Visit: Payer: Self-pay | Admitting: Oncology

## 2012-09-19 ENCOUNTER — Other Ambulatory Visit: Payer: Self-pay | Admitting: Radiology

## 2012-09-21 ENCOUNTER — Ambulatory Visit (HOSPITAL_COMMUNITY): Payer: Self-pay

## 2012-09-22 ENCOUNTER — Ambulatory Visit (HOSPITAL_COMMUNITY)
Admission: RE | Admit: 2012-09-22 | Discharge: 2012-09-22 | Disposition: A | Discharge status: Home / Self Care | Payer: Self-pay | Source: Ambulatory Visit | Attending: Hematology & Oncology | Admitting: Hematology & Oncology

## 2012-09-22 ENCOUNTER — Encounter (HOSPITAL_COMMUNITY): Payer: Self-pay

## 2012-09-22 DIAGNOSIS — C6290 Malignant neoplasm of unspecified testis, unspecified whether descended or undescended: Secondary | ICD-10-CM | POA: Insufficient documentation

## 2012-09-22 DIAGNOSIS — Z9221 Personal history of antineoplastic chemotherapy: Secondary | ICD-10-CM | POA: Insufficient documentation

## 2012-09-22 DIAGNOSIS — Z452 Encounter for adjustment and management of vascular access device: Secondary | ICD-10-CM | POA: Insufficient documentation

## 2012-09-22 LAB — CBC WITH DIFFERENTIAL/PLATELET
Eosinophils Relative: 2 % (ref 0–5)
HCT: 46.8 % (ref 39.0–52.0)
Lymphocytes Relative: 48 % — ABNORMAL HIGH (ref 12–46)
Lymphs Abs: 2.4 10*3/uL (ref 0.7–4.0)
MCV: 87.3 fL (ref 78.0–100.0)
Monocytes Absolute: 0.5 10*3/uL (ref 0.1–1.0)
RBC: 5.36 MIL/uL (ref 4.22–5.81)
WBC: 5 10*3/uL (ref 4.0–10.5)

## 2012-09-22 MED ORDER — CEFAZOLIN SODIUM-DEXTROSE 2-3 GM-% IV SOLR
2.0000 g | Freq: Once | INTRAVENOUS | Status: AC
  Administered 2012-09-22: 2 g via INTRAVENOUS
  Filled 2012-09-22: qty 50

## 2012-09-22 MED ORDER — SODIUM CHLORIDE 0.9 % IV SOLN
INTRAVENOUS | Status: DC
  Administered 2012-09-22: 08:00:00 via INTRAVENOUS

## 2012-09-22 NOTE — H&P (Signed)
Devin Finley is an 32 y.o. male.   Chief Complaint: Testicular seminoma/Ca Port a Cath placed 05/2011 Last chemo Aug 2013 Flushing every 2-3 months per pt No longer needed per Dr Myna Hidalgo Scheduled now for [facility] removal HPI: Testicular Ca  Past Medical History  Diagnosis Date  . Cancer     testicular seminoma  . 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 Testicular cancer     Past Surgical History  Procedure Laterality Date  . Surgery scrotal / testicular      History reviewed. No pertinent family history. Social History:  reports that he has never smoked. He does not have any smokeless tobacco history on file. He reports that he does not drink alcohol or use illicit drugs.  Allergies: No Known Allergies   (Not in a hospital admission)  Results for orders placed during the hospital encounter of 09/22/12 (from the past 48 hour(s))  CBC WITH DIFFERENTIAL     Status: Abnormal   Collection Time    09/22/12  8:00 AM      Result Value Range   WBC 5.0  4.0 - 10.5 K/uL   RBC 5.36  4.22 - 5.81 MIL/uL   Hemoglobin 16.0  13.0 - 17.0 g/dL   HCT 16.1  09.6 - 04.5 %   MCV 87.3  78.0 - 100.0 fL   MCH 29.9  26.0 - 34.0 pg   MCHC 34.2  30.0 - 36.0 g/dL   RDW 40.9  81.1 - 91.4 %   Platelets 166  150 - 400 K/uL   Neutrophils Relative % 40 (*) 43 - 77 %   Neutro Abs 2.0  1.7 - 7.7 K/uL   Lymphocytes Relative 48 (*) 12 - 46 %   Lymphs Abs 2.4  0.7 - 4.0 K/uL   Monocytes Relative 10  3 - 12 %   Monocytes Absolute 0.5  0.1 - 1.0 K/uL   Eosinophils Relative 2  0 - 5 %   Eosinophils Absolute 0.1  0.0 - 0.7 K/uL   Basophils Relative 0  0 - 1 %   Basophils Absolute 0.0  0.0 - 0.1 K/uL  PROTIME-INR     Status: None   Collection Time    09/22/12  8:00 AM      Result Value Range   Prothrombin Time 12.6  11.6 - 15.2 seconds   INR 0.96  0.00 - 1.49   No results found.  Review of Systems  Constitutional: Negative for fever.  Respiratory: Negative for shortness of breath.   Cardiovascular:  Negative for chest pain.  Gastrointestinal: Negative for nausea, vomiting and abdominal pain.    There were no vitals taken for this visit. Physical Exam  Constitutional: He is oriented to person, place, and time. He appears well-developed and well-nourished.  Cardiovascular: Normal rate, regular rhythm and normal heart sounds.   No murmur heard. Respiratory: Effort normal and breath sounds normal. He has no wheezes.  GI: Soft. Bowel sounds are normal. There is no tenderness.  Musculoskeletal: Normal range of motion.  Neurological: He is alert and oriented to person, place, and time.  Skin: Skin is warm and dry.  Psychiatric: He has a normal mood and affect. His behavior is normal. Judgment and thought content normal.     Assessment/Plan Hx Testicular Ca PAC placed 05/2011 Last chemo 10/2011 No longer needed Removal scheduled now Pt aware of procedure benefits and risks and agreeable to proceed Consent signed and in chart  Mohab Ashby A 09/22/2012, 8:20 AM

## 2012-09-22 NOTE — H&P (Signed)
Agree with PA note.    Signed,  Gwyn Mehring K. Javanna Patin, MD Vascular & Interventional Radiologist [facility]  

## 2012-09-22 NOTE — Procedures (Signed)
Interventional Radiology Procedure Note  Procedure: Removal or Right IJ approach portacatheter. Complications: None Recommendations: - Routine wound care  Signed,  Sterling Big, MD Vascular & Interventional Radiologist [facility]

## 2013-04-26 ENCOUNTER — Telehealth: Payer: Self-pay | Admitting: Hematology & Oncology

## 2013-04-26 NOTE — Telephone Encounter (Signed)
Pt called made 2-11 appointment

## 2013-05-02 ENCOUNTER — Telehealth: Payer: Self-pay | Admitting: Hematology & Oncology

## 2013-05-02 NOTE — Telephone Encounter (Signed)
Pt called and cx 2-11 said would call back to reschedule

## 2013-05-03 ENCOUNTER — Other Ambulatory Visit: Payer: Self-pay | Admitting: Lab

## 2013-05-03 ENCOUNTER — Ambulatory Visit: Payer: Self-pay | Admitting: Hematology & Oncology

## 2013-09-28 ENCOUNTER — Other Ambulatory Visit: Payer: Self-pay | Admitting: *Deleted

## 2013-09-28 DIAGNOSIS — I2699 Other pulmonary embolism without acute cor pulmonale: Secondary | ICD-10-CM

## 2013-09-29 ENCOUNTER — Ambulatory Visit (HOSPITAL_BASED_OUTPATIENT_CLINIC_OR_DEPARTMENT_OTHER): Payer: Self-pay | Admitting: Hematology & Oncology

## 2013-09-29 ENCOUNTER — Other Ambulatory Visit (HOSPITAL_BASED_OUTPATIENT_CLINIC_OR_DEPARTMENT_OTHER): Payer: Self-pay | Admitting: Lab

## 2013-09-29 ENCOUNTER — Encounter: Payer: Self-pay | Admitting: Hematology & Oncology

## 2013-09-29 VITALS — BP 117/72 | HR 58 | Temp 98.4°F | Resp 18 | Ht 72.0 in | Wt 224.0 lb

## 2013-09-29 DIAGNOSIS — C6290 Malignant neoplasm of unspecified testis, unspecified whether descended or undescended: Secondary | ICD-10-CM

## 2013-09-29 DIAGNOSIS — C6291 Malignant neoplasm of right testis, unspecified whether descended or undescended: Secondary | ICD-10-CM

## 2013-09-29 DIAGNOSIS — E785 Hyperlipidemia, unspecified: Secondary | ICD-10-CM

## 2013-09-29 DIAGNOSIS — Z86718 Personal history of other venous thrombosis and embolism: Secondary | ICD-10-CM

## 2013-09-29 DIAGNOSIS — Z86711 Personal history of pulmonary embolism: Secondary | ICD-10-CM

## 2013-09-29 DIAGNOSIS — I2699 Other pulmonary embolism without acute cor pulmonale: Secondary | ICD-10-CM

## 2013-09-29 LAB — CBC WITH DIFFERENTIAL (CANCER CENTER ONLY)
BASO#: 0 10*3/uL (ref 0.0–0.2)
BASO%: 0.4 % (ref 0.0–2.0)
EOS%: 1.8 % (ref 0.0–7.0)
Eosinophils Absolute: 0.1 10*3/uL (ref 0.0–0.5)
HCT: 46.8 % (ref 38.7–49.9)
HGB: 16.3 g/dL (ref 13.0–17.1)
LYMPH#: 2 10*3/uL (ref 0.9–3.3)
LYMPH%: 44.2 % (ref 14.0–48.0)
MCH: 31.5 pg (ref 28.0–33.4)
MCHC: 34.8 g/dL (ref 32.0–35.9)
MCV: 90 fL (ref 82–98)
MONO#: 0.4 10*3/uL (ref 0.1–0.9)
MONO%: 8.4 % (ref 0.0–13.0)
NEUT%: 45.2 % (ref 40.0–80.0)
NEUTROS ABS: 2.1 10*3/uL (ref 1.5–6.5)
Platelets: 161 10*3/uL (ref 145–400)
RBC: 5.18 10*6/uL (ref 4.20–5.70)
RDW: 12.8 % (ref 11.1–15.7)
WBC: 4.6 10*3/uL (ref 4.0–10.0)

## 2013-10-03 LAB — BETA HCG QUANT (REF LAB): Beta hCG, Tumor Marker: <2 m[IU]/mL (ref <5.0)

## 2013-10-03 LAB — COMPREHENSIVE METABOLIC PANEL
ALBUMIN: 4.4 g/dL (ref 3.5–5.2)
ALK PHOS: 94 U/L (ref 39–117)
ALT: 46 U/L (ref 0–53)
AST: 31 U/L (ref 0–37)
BUN: 19 mg/dL (ref 6–23)
CALCIUM: 9.3 mg/dL (ref 8.4–10.5)
CHLORIDE: 106 meq/L (ref 96–112)
CO2: 23 mEq/L (ref 19–32)
CREATININE: 0.96 mg/dL (ref 0.50–1.35)
Glucose, Bld: 101 mg/dL — ABNORMAL HIGH (ref 70–99)
POTASSIUM: 4.4 meq/L (ref 3.5–5.3)
Sodium: 137 mEq/L (ref 135–145)
Total Bilirubin: 0.4 mg/dL (ref 0.2–1.2)
Total Protein: 7.4 g/dL (ref 6.0–8.3)

## 2013-10-03 LAB — LACTATE DEHYDROGENASE: LDH: 249 U/L (ref 94–250)

## 2013-10-03 LAB — AFP TUMOR MARKER: AFP TUMOR MARKER: 3.5 ng/mL (ref 0.0–8.0)

## 2013-10-04 ENCOUNTER — Telehealth: Payer: Self-pay | Admitting: *Deleted

## 2013-10-04 NOTE — Telephone Encounter (Addendum)
Message copied by Lenn Sink on Wed Oct 04, 2013 12:31 PM ------      Message from: Burney Gauze R      Created: Wed Oct 04, 2013  7:18 AM       Call - all labs look normal.  pete ------Informed pt that labs look normal.

## 2013-10-05 ENCOUNTER — Telehealth: Payer: Self-pay | Admitting: Hematology & Oncology

## 2013-10-05 NOTE — Telephone Encounter (Signed)
Pt aware of 8-7 CT to be NPO 4 hrs and drink contrast

## 2013-10-05 NOTE — Telephone Encounter (Signed)
Left message with 04-05-14 appointment and to call for details of 8-6 scan appointment

## 2013-10-05 NOTE — Telephone Encounter (Signed)
Pt has no insurance and was sent a letter advising, we have on file that you have no insurance. You may qualify for our [place] Program, as well as, [place]. If you wish to apply, please complete both applications enclosed and bring back to me at Dr. Dicie Beam office and/or take the Medicaid application back to your local DSS and mail the CAFA application back to the address located on the bottom of  the last page of application. You can also apply for Medicaid by calling [phone].   If you have insurance at this time, please disregard this letter and bring your updated insurance card to your next office visit.   If you have any additional questions please do not hesitate to contact me by phone at [phone].  My office hours are Monday through Friday from 8:00 am to 4:30 pm.

## 2013-10-05 NOTE — Progress Notes (Signed)
Hematology and Oncology Follow Up Visit  AMIL BOUWMAN [id number] [birth year] 33 y.o. 10/05/2013   Principle Diagnosis:  1. Recurrent testicular seminoma. 2. Pulmonary embolism.  Current Therapy:    observation     Interim History:  Mr.  Ploeger is back for followup. Last time we saw him was over a year ago. He has been do well. He's working. Is having no problems with pain. No fatigue.there is no change in bowel or bladder habits. There is no cough or shortness of breath. There is no leg pain. He has had no bleeding. He has had no fever. He has had no headache.  The last time we did scans on him we'll probably a year ago.  His tumor markers have all been normal.  Medications: Current outpatient prescriptions:protein supplement (UNJURY VANILLA) POWD, Take by mouth every morning. 60 grams daily., Disp: , Rfl:   Allergies: No Known Allergies  Past Medical History, Surgical history, Social history, and Family History were reviewed and updated.  Review of Systems: As above  Physical Exam:  height is 6' (1.829 m) and weight is 224 lb (101.606 kg). His oral temperature is 98.4 F (36.9 C). His blood pressure is 117/72 and his pulse is 58. His respiration is 18.   Well developed and well nourished black abdomen. Head and neck exam shows no ocular or oral lesions. He has no cervical or supraclavicular lymph nodes. Lungs are clear bilaterally. Cardiac exam is regular in rhythm. Abdomen is soft. Has good bowel sounds. There is no fluid wave. There is no liver or spleen tip. Back exam shows no tenderness over the spine ribs or hips. Extremities shows no clubbing cyanosis or edema. He has good range of motion of his joints. He has good muscle strength. Skin exam no rashes. Neurological exam is nonfocal.  Lab Results  Component Value Date   WBC 4.6 09/29/2013   HGB 16.3 09/29/2013   HCT 46.8 09/29/2013   MCV 90 09/29/2013   PLT 161 09/29/2013     Chemistry      Component Value Date/Time   NA 137 09/29/2013 0824   NA 139 01/07/2012 0808   K 4.4 09/29/2013 0824   K 4.4 01/07/2012 0808   CL 106 09/29/2013 0824   CL 104 01/07/2012 0808   CO2 23 09/29/2013 0824   CO2 26 01/07/2012 0808   BUN 19 09/29/2013 0824   BUN 21 01/07/2012 0808   CREATININE 0.96 09/29/2013 0824   CREATININE 1.0 01/07/2012 0808      Component Value Date/Time   CALCIUM 9.3 09/29/2013 0824   CALCIUM 9.2 01/07/2012 0808   ALKPHOS 94 09/29/2013 0824   ALKPHOS 99* 01/07/2012 0808   AST 31 09/29/2013 0824   AST 40* 01/07/2012 0808   ALT 46 09/29/2013 0824   ALT 47 01/07/2012 0808   BILITOT 0.4 09/29/2013 0824   BILITOT 0.40 01/07/2012 0808     LDH is 249.beta-hCG is less than 2.0. alpha fetoprotein is 3.1.    Impression and Plan: Mr. Debold is 33 year old African American gentleman with recurrent testicular seminoma. He was treated with systemic chemotherapy. He had cis-platinum with etoposide. 4 cycles were completed back in June of 2013.  He did have a pulmonary embolism with this. He did have a DVT in the right leg. He was on 32-year-old Arixtra. He is on aspirin.  We can probably do need to do a followup scan on him. This I believe would be standard of care. Diffuse set up.  I will plan to see him back in 6 months.  I'm just glad that he is doing well.   Volanda Napoleon, MD 7/16/20156:57 AM

## 2013-10-24 ENCOUNTER — Telehealth: Payer: Self-pay | Admitting: Hematology & Oncology

## 2013-10-24 NOTE — Telephone Encounter (Signed)
Pt called said his girlfriend made appointments for him and [birth year] is [birth year] and he wants to cx 8-7 CT. I gave him number to call radiology to reschedule

## 2013-10-26 ENCOUNTER — Other Ambulatory Visit (HOSPITAL_BASED_OUTPATIENT_CLINIC_OR_DEPARTMENT_OTHER): Payer: Self-pay

## 2013-10-27 ENCOUNTER — Ambulatory Visit (HOSPITAL_BASED_OUTPATIENT_CLINIC_OR_DEPARTMENT_OTHER): Payer: Self-pay

## 2013-11-03 ENCOUNTER — Encounter (HOSPITAL_BASED_OUTPATIENT_CLINIC_OR_DEPARTMENT_OTHER): Payer: Self-pay

## 2013-11-03 ENCOUNTER — Ambulatory Visit (HOSPITAL_BASED_OUTPATIENT_CLINIC_OR_DEPARTMENT_OTHER)
Admission: RE | Admit: 2013-11-03 | Discharge: 2013-11-03 | Disposition: A | Discharge status: Home / Self Care | Payer: Self-pay | Source: Ambulatory Visit | Attending: Hematology & Oncology | Admitting: Hematology & Oncology

## 2013-11-03 DIAGNOSIS — M87059 Idiopathic aseptic necrosis of unspecified femur: Secondary | ICD-10-CM | POA: Insufficient documentation

## 2013-11-03 DIAGNOSIS — C6291 Malignant neoplasm of right testis, unspecified whether descended or undescended: Secondary | ICD-10-CM

## 2013-11-03 DIAGNOSIS — C6290 Malignant neoplasm of unspecified testis, unspecified whether descended or undescended: Secondary | ICD-10-CM | POA: Insufficient documentation

## 2013-11-03 DIAGNOSIS — R599 Enlarged lymph nodes, unspecified: Secondary | ICD-10-CM | POA: Insufficient documentation

## 2013-11-03 DIAGNOSIS — E785 Hyperlipidemia, unspecified: Secondary | ICD-10-CM

## 2013-11-03 MED ORDER — IOHEXOL 300 MG/ML  SOLN
100.0000 mL | Freq: Once | INTRAMUSCULAR | Status: AC | PRN
  Administered 2013-11-03: 100 mL via INTRAVENOUS

## 2013-12-06 ENCOUNTER — Other Ambulatory Visit: Payer: Self-pay | Admitting: Hematology & Oncology

## 2013-12-06 DIAGNOSIS — C6292 Malignant neoplasm of left testis, unspecified whether descended or undescended: Secondary | ICD-10-CM

## 2013-12-14 ENCOUNTER — Encounter (HOSPITAL_COMMUNITY)
Admission: RE | Admit: 2013-12-14 | Discharge: 2013-12-14 | Disposition: A | Discharge status: Home / Self Care | Payer: Self-pay | Source: Ambulatory Visit | Attending: Hematology & Oncology | Admitting: Hematology & Oncology

## 2013-12-14 DIAGNOSIS — C6292 Malignant neoplasm of left testis, unspecified whether descended or undescended: Secondary | ICD-10-CM

## 2013-12-14 DIAGNOSIS — C6290 Malignant neoplasm of unspecified testis, unspecified whether descended or undescended: Secondary | ICD-10-CM | POA: Insufficient documentation

## 2013-12-14 LAB — GLUCOSE, CAPILLARY: Glucose-Capillary: 101 mg/dL — ABNORMAL HIGH (ref 70–99)

## 2013-12-14 MED ORDER — FLUDEOXYGLUCOSE F - 18 (FDG) INJECTION
11.2000 | Freq: Once | INTRAVENOUS | Status: AC | PRN
  Administered 2013-12-14: 11:00:00 via INTRAVENOUS

## 2013-12-20 ENCOUNTER — Telehealth: Payer: Self-pay | Admitting: Hematology & Oncology

## 2013-12-20 ENCOUNTER — Other Ambulatory Visit: Payer: Self-pay | Admitting: Hematology & Oncology

## 2013-12-20 DIAGNOSIS — C6291 Malignant neoplasm of right testis, unspecified whether descended or undescended: Secondary | ICD-10-CM

## 2013-12-20 NOTE — Telephone Encounter (Signed)
Left pt message 10-6 appointment

## 2013-12-26 ENCOUNTER — Ambulatory Visit (HOSPITAL_BASED_OUTPATIENT_CLINIC_OR_DEPARTMENT_OTHER): Payer: Self-pay | Admitting: Hematology & Oncology

## 2013-12-26 ENCOUNTER — Encounter: Payer: Self-pay | Admitting: Hematology & Oncology

## 2013-12-26 ENCOUNTER — Other Ambulatory Visit (HOSPITAL_BASED_OUTPATIENT_CLINIC_OR_DEPARTMENT_OTHER): Payer: Self-pay | Admitting: Lab

## 2013-12-26 VITALS — BP 119/68 | HR 78 | Temp 97.7°F | Resp 18 | Ht 72.0 in | Wt 226.0 lb

## 2013-12-26 DIAGNOSIS — R978 Other abnormal tumor markers: Secondary | ICD-10-CM

## 2013-12-26 DIAGNOSIS — C6291 Malignant neoplasm of right testis, unspecified whether descended or undescended: Secondary | ICD-10-CM

## 2013-12-26 DIAGNOSIS — E785 Hyperlipidemia, unspecified: Secondary | ICD-10-CM

## 2013-12-26 DIAGNOSIS — C6292 Malignant neoplasm of left testis, unspecified whether descended or undescended: Secondary | ICD-10-CM

## 2013-12-26 LAB — CBC WITH DIFFERENTIAL (CANCER CENTER ONLY)
BASO#: 0 10*3/uL (ref 0.0–0.2)
BASO%: 0.4 % (ref 0.0–2.0)
EOS%: 1.2 % (ref 0.0–7.0)
Eosinophils Absolute: 0.1 10*3/uL (ref 0.0–0.5)
HEMATOCRIT: 45.4 % (ref 38.7–49.9)
HGB: 16.3 g/dL (ref 13.0–17.1)
LYMPH#: 1.5 10*3/uL (ref 0.9–3.3)
LYMPH%: 30 % (ref 14.0–48.0)
MCH: 32 pg (ref 28.0–33.4)
MCHC: 35.9 g/dL (ref 32.0–35.9)
MCV: 89 fL (ref 82–98)
MONO#: 0.3 10*3/uL (ref 0.1–0.9)
MONO%: 6.3 % (ref 0.0–13.0)
NEUT#: 3.1 10*3/uL (ref 1.5–6.5)
NEUT%: 62.1 % (ref 40.0–80.0)
Platelets: 177 10*3/uL (ref 145–400)
RBC: 5.09 10*6/uL (ref 4.20–5.70)
RDW: 12.7 % (ref 11.1–15.7)
WBC: 5.1 10*3/uL (ref 4.0–10.0)

## 2013-12-26 LAB — CMP (CANCER CENTER ONLY)
ALBUMIN: 4 g/dL (ref 3.3–5.5)
ALT(SGPT): 46 U/L (ref 10–47)
AST: 30 U/L (ref 11–38)
Alkaline Phosphatase: 77 U/L (ref 26–84)
BUN: 15 mg/dL (ref 7–22)
CALCIUM: 9 mg/dL (ref 8.0–10.3)
CHLORIDE: 101 meq/L (ref 98–108)
CO2: 25 meq/L (ref 18–33)
Creat: 0.8 mg/dl (ref 0.6–1.2)
Glucose, Bld: 133 mg/dL — ABNORMAL HIGH (ref 73–118)
POTASSIUM: 3.7 meq/L (ref 3.3–4.7)
Sodium: 141 mEq/L (ref 128–145)
Total Bilirubin: 0.7 mg/dl (ref 0.20–1.60)
Total Protein: 7.4 g/dL (ref 6.4–8.1)

## 2013-12-26 NOTE — Progress Notes (Signed)
Hematology and Oncology Follow Up Visit  Devin Finley [id number] 28-Dec-1980 33 y.o. 12/26/2013   Principle Diagnosis:  1. Recurrent testicular seminoma. 2. Pulmonary embolism.  Current Therapy:    Observation     Interim History:  Devin Finley is back for followup. Unfortunately, it looks like there may be recurrence. We did go ahead and did a routine CT scan on him. This was done in August. There was an enlarged left cervical lymph node measuring 0.7 cm in short axis. He also was noted to have a 10 mm mesenteric root lymph node. A second 10 mm mesenteric lymph node was noted. There was also noted to be a left periaortic lymph node. This measured 0.8 cm.  We then went ahead and did a PET scan. The PET scan showed mild activity in the left supraclavicular region and also in the mesenteric root area.  His last tumor markers back in July were normal.  He feels well. He has some slight abdominal discomfort. He has had no nausea vomiting. He's had no fever. Had no problems go to the bathroom. He's had no leg swelling. He had no cough. He's had no swallowing problems.  He is still working. He's had no problems working. He still exercising and working out.  Overall, his performance status is ECOG 0  Medications: No current outpatient prescriptions on file.  Allergies: No Known Allergies  Past Medical History, Surgical history, Social history, and Family History were reviewed and updated.  Review of Systems: As above  Physical Exam:  height is 6' (1.829 m) and weight is 226 lb (102.513 kg). His oral temperature is 97.7 F (36.5 C). His blood pressure is 119/68 and his pulse is 78. His respiration is 18.   Well-developed and well-nourished African American gentleman. Head and neck exam shows no ocular or oral lesions. There are no palpable cervical or supraclavicular lymph nodes. Lungs are clear. There is no wheezes rales or rhonchi. Cardiac exam regular in rhythm. She has no  murmurs, rubs or bruits. Axillary exam shows no bilateral axillary adenopathy. Abdomen is soft. There is some slight tenderness over on the left periumbilical region. No masses noted. No fluid wave is noted. There is no palpable liver or spleen tip. There is no inguinal adenopathy bilaterally. Back exam shows no tenderness over the spine, ribs or hips. Extremities shows no clubbing, cyanosis or edema. Skin exam no rashes, ecchymosis or petechia.  Lab Results  Component Value Date   WBC 5.1 12/26/2013   HGB 16.3 12/26/2013   HCT 45.4 12/26/2013   MCV 89 12/26/2013   PLT 177 12/26/2013     Chemistry      Component Value Date/Time   NA 141 12/26/2013 1011   NA 137 09/29/2013 0824   K 3.7 12/26/2013 1011   K 4.4 09/29/2013 0824   CL 101 12/26/2013 1011   CL 106 09/29/2013 0824   CO2 25 12/26/2013 1011   CO2 23 09/29/2013 0824   BUN 15 12/26/2013 1011   BUN 19 09/29/2013 0824   CREATININE 0.8 12/26/2013 1011   CREATININE 0.96 09/29/2013 0824      Component Value Date/Time   CALCIUM 9.0 12/26/2013 1011   CALCIUM 9.3 09/29/2013 0824   ALKPHOS 77 12/26/2013 1011   ALKPHOS 94 09/29/2013 0824   AST 30 12/26/2013 1011   AST 31 09/29/2013 0824   ALT 46 12/26/2013 1011   ALT 46 09/29/2013 0824   BILITOT 0.70 12/26/2013 1011   BILITOT 0.4  09/29/2013 0824         Impression and Plan: Devin Finley is 33 year old gentleman. He has a history of recurrent seminoma. He does have a want a left inguinal orchiectomy. We gave him single agent treatment with carboplatin him after this.  He then had a recurrence. We then treated him with systemic chemotherapy. He had cis-platinum with etoposide. He had 4 cycles. This was completed in June of 2013.  Again, I does worry that he has recurrence. The fact that there is some activity in the left supraclavicular region is troublesome. This would be where I would expect recurrence.  He needs a biopsy. He lives in [place]. I'll see if he cannot see Dr. Willadean Carol at  [facility]. I think this would be a lot easier for Devin Finley.  He came in with his girlfriend. I spent about 45 minutes with he and her. I went over the x-rays. I explained why he needs a biopsy.  If his tumor markers are elevated, that is clearly would indicate recurrence.  He has recurrence, he may be a candidate for stem cell transplant. Again, we can see about him being treated down in [place]. This would be a lot easier for him.    Volanda Napoleon, MD 10/6/201511:45 AM

## 2013-12-29 ENCOUNTER — Encounter: Payer: Self-pay | Admitting: Nurse Practitioner

## 2013-12-29 LAB — LIPID PANEL
CHOL/HDL RATIO: 4.3 ratio
CHOLESTEROL: 158 mg/dL (ref 0–200)
HDL: 37 mg/dL — ABNORMAL LOW (ref >39)
LDL Cholesterol: 70 mg/dL (ref 0–99)
Triglycerides: 254 mg/dL — ABNORMAL HIGH (ref <150)
VLDL: 51 mg/dL — ABNORMAL HIGH (ref 0–40)

## 2013-12-29 LAB — AFP TUMOR MARKER-PREVIOUS METHOD: AFP Tumor Marker: 3.7 ng/mL (ref 0.0–8.0)

## 2013-12-29 LAB — LACTATE DEHYDROGENASE: LDH: 226 U/L (ref 94–250)

## 2013-12-29 LAB — BETA HCG QUANT (REF LAB): Beta hCG, Tumor Marker: <2 m[IU]/mL (ref <5.0)

## 2013-12-29 LAB — AFP TUMOR MARKER: AFP-Tumor Marker: 3.3 ng/mL

## 2013-12-29 NOTE — Progress Notes (Signed)
Request sent to radiology for all scans to be placed on a disc and sent to [place] ex# [id number]. Confirmation received.

## 2014-04-05 ENCOUNTER — Telehealth: Payer: Self-pay | Admitting: Hematology & Oncology

## 2014-04-05 ENCOUNTER — Ambulatory Visit: Payer: Self-pay | Admitting: Hematology & Oncology

## 2014-04-05 ENCOUNTER — Other Ambulatory Visit: Payer: Self-pay | Admitting: Lab

## 2014-04-05 NOTE — Telephone Encounter (Signed)
Left pt message to call and reschedule missed appointment °

## 2014-04-24 ENCOUNTER — Other Ambulatory Visit: Payer: Self-pay | Admitting: Family
